# Patient Record
Sex: Female | Born: 1995 | Race: White | Hispanic: No | Marital: Single | Smoking: Former smoker
Health system: Southern US, Community
[De-identification: ages and names within clinical notes are randomized; demographics above are authoritative.]

## PROBLEM LIST (undated history)

## (undated) DIAGNOSIS — F329 Major depressive disorder, single episode, unspecified: Secondary | ICD-10-CM

## (undated) DIAGNOSIS — F4001 Agoraphobia with panic disorder: Secondary | ICD-10-CM

## (undated) DIAGNOSIS — F419 Anxiety disorder, unspecified: Secondary | ICD-10-CM

---
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
## 2018-06-24 ENCOUNTER — Emergency Department
Admission: EM | Admit: 2018-06-24 | Discharge: 2018-06-24 | Disposition: A | Discharge status: Home / Self Care | Payer: BLUE CROSS/BLUE SHIELD | Attending: Emergency Medicine | Admitting: Emergency Medicine

## 2018-06-24 ENCOUNTER — Encounter: Payer: Self-pay | Admitting: Emergency Medicine

## 2018-06-24 ENCOUNTER — Other Ambulatory Visit: Payer: Self-pay

## 2018-06-24 ENCOUNTER — Emergency Department: Payer: BLUE CROSS/BLUE SHIELD

## 2018-06-24 DIAGNOSIS — R109 Unspecified abdominal pain: Secondary | ICD-10-CM | POA: Diagnosis present

## 2018-06-24 DIAGNOSIS — R319 Hematuria, unspecified: Secondary | ICD-10-CM | POA: Insufficient documentation

## 2018-06-24 DIAGNOSIS — Z79899 Other long term (current) drug therapy: Secondary | ICD-10-CM | POA: Insufficient documentation

## 2018-06-24 DIAGNOSIS — N390 Urinary tract infection, site not specified: Secondary | ICD-10-CM | POA: Diagnosis not present

## 2018-06-24 LAB — CBC
HCT: 44.2 % (ref 36.0–46.0)
Hemoglobin: 15.2 g/dL — ABNORMAL HIGH (ref 12.0–15.0)
MCH: 31.6 pg (ref 26.0–34.0)
MCHC: 34.4 g/dL (ref 30.0–36.0)
MCV: 91.9 fL (ref 80.0–100.0)
Platelets: 255 10*3/uL (ref 150–400)
RBC: 4.81 MIL/uL (ref 3.87–5.11)
RDW: 12.7 % (ref 11.5–15.5)
WBC: 12.7 10*3/uL — ABNORMAL HIGH (ref 4.0–10.5)
nRBC: 0 % (ref 0.0–0.2)

## 2018-06-24 LAB — COMPREHENSIVE METABOLIC PANEL
ALT: 21 U/L (ref 0–44)
AST: 35 U/L (ref 15–41)
Albumin: 4.5 g/dL (ref 3.5–5.0)
Alkaline Phosphatase: 63 U/L (ref 38–126)
Anion gap: 10 (ref 5–15)
BUN: 12 mg/dL (ref 6–20)
CO2: 27 mmol/L (ref 22–32)
Calcium: 9.8 mg/dL (ref 8.9–10.3)
Chloride: 102 mmol/L (ref 98–111)
Creatinine, Ser: 0.94 mg/dL (ref 0.44–1.00)
GFR calc Af Amer: >60 mL/min (ref >60)
GFR calc non Af Amer: >60 mL/min (ref >60)
Glucose, Bld: 101 mg/dL — ABNORMAL HIGH (ref 70–99)
Potassium: 3.8 mmol/L (ref 3.5–5.1)
Sodium: 139 mmol/L (ref 135–145)
Total Bilirubin: 0.7 mg/dL (ref 0.3–1.2)
Total Protein: 7.8 g/dL (ref 6.5–8.1)

## 2018-06-24 LAB — URINALYSIS, COMPLETE (UACMP) WITH MICROSCOPIC
Bilirubin Urine: NEGATIVE
Glucose, UA: NEGATIVE mg/dL
Ketones, ur: NEGATIVE mg/dL
Nitrite: POSITIVE — AB
Protein, ur: 100 mg/dL — AB
RBC / HPF: >50 RBC/hpf — ABNORMAL HIGH (ref 0–5)
Specific Gravity, Urine: 1.016 (ref 1.005–1.030)
WBC, UA: >50 WBC/hpf — ABNORMAL HIGH (ref 0–5)
pH: 5 (ref 5.0–8.0)

## 2018-06-24 LAB — POCT PREGNANCY, URINE: Preg Test, Ur: NEGATIVE

## 2018-06-24 LAB — LIPASE, BLOOD: Lipase: 25 U/L (ref 11–51)

## 2018-06-24 MED ORDER — CEFTRIAXONE SODIUM 1 G IJ SOLR
1.0000 g | Freq: Once | INTRAMUSCULAR | Status: AC
  Administered 2018-06-24: 1 g via INTRAMUSCULAR
  Filled 2018-06-24: qty 10

## 2018-06-24 MED ORDER — LIDOCAINE HCL (PF) 1 % IJ SOLN
5.0000 mL | Freq: Once | INTRAMUSCULAR | Status: AC
  Administered 2018-06-24: 5 mL via INTRADERMAL
  Filled 2018-06-24: qty 5

## 2018-06-24 MED ORDER — NITROFURANTOIN MONOHYD MACRO 100 MG PO CAPS: 100.0000 mg | ORAL_CAPSULE | Freq: Two times a day (BID) | ORAL | 0 refills | Status: DC

## 2018-06-24 MED ORDER — SODIUM CHLORIDE 0.9% FLUSH: 3.0000 mL | Freq: Once | INTRAVENOUS | Status: DC

## 2018-06-24 NOTE — ED Notes (Signed)
See triage note  States she developed some discomfort to right flank area and into right abd  States she had some uti sx's about 1 week ago  But pain became worse this am

## 2018-06-24 NOTE — ED Triage Notes (Signed)
Lower R abdominal pain since this am. Denies dysuria.

## 2018-06-24 NOTE — ED Provider Notes (Signed)
[facility] Emergency Department Provider Note  ____________________________________________   First MD Initiated Contact with Patient 06/24/18 1507     (approximate)  I have reviewed the triage vital signs and the nursing notes.   HISTORY  Chief Complaint Abdominal Pain    HPI Dana Mcmillan is a 23 y.o. female presents emergency department complaint of abdominal pain.  She states that discomfort is in the right flank area and then goes into the lower abdomen.  [place] took some Azo and felt better.  History of UTIs.  She states the pain became worse this morning.  She denies fever, chills, vomiting or diarrhea.    Past Medical History:  Diagnosis Date  . Anxiety   . MDD (major depressive disorder)   . Panic disorder with agoraphobia and moderate panic attacks     There are no active problems to display for this patient.   History reviewed. No pertinent surgical history.  Prior to Admission medications   Medication Sig Start Date End Date Taking? Authorizing Provider  nitrofurantoin, macrocrystal-monohydrate, (MACROBID) 100 MG capsule Take 1 capsule (100 mg total) by mouth 2 (two) times daily. 06/24/18   Sherrie MustacheFisher, Roselyn BeringSusan W, PA-C  NUVARING 0.12-0.015 MG/24HR vaginal ring Place 1 each vaginally every 28 (twenty-eight) days.  10/19/15   [provider]  ondansetron (ZOFRAN ODT) 4 MG disintegrating tablet Take 1 tablet (4 mg total) by mouth every 8 (eight) hours as needed for nausea or vomiting. 11/03/15   Derwood KaplanNanavati, Ankit, MD  ondansetron (ZOFRAN) 4 MG tablet Take 1 tablet (4 mg total) by mouth every 8 (eight) hours as needed for nausea or vomiting. 04/18/16   Tilden Fossaees, Elizabeth, MD    Allergies Patient has no known allergies.  No family history on file.  Social History Social History   Tobacco Use  . Smoking status: Never Smoker  Substance Use Topics  . Alcohol use: No  . Drug use: No    Review of Systems   Constitutional: No fever/chills Eyes: No visual changes. ENT: No sore throat. Respiratory: Denies cough Gastrointestinal: Positive for right-sided abdominal pain Genitourinary: Negative for dysuria. Musculoskeletal: Negative for back pain. Skin: Negative for rash.    ____________________________________________   PHYSICAL EXAM:  VITAL SIGNS: ED Triage Vitals  Enc Vitals Group     BP 06/24/18 1402 138/83     Pulse Rate 06/24/18 1402 89     Resp 06/24/18 1402 18     Temp 06/24/18 1402 98.4 F (36.9 C)     Temp Source 06/24/18 1402 Oral     SpO2 06/24/18 1402 99 %     Weight 06/24/18 1402 125 lb (56.7 kg)     Height 06/24/18 1402 5\' 6"  (1.676 m)     Head Circumference --      Peak Flow --      Pain Score 06/24/18 1404 8     Pain Loc --      Pain Edu? --      Excl. in GC? --     Constitutional: Alert and oriented. Well appearing and in no acute distress. Eyes: Conjunctivae are normal.  Head: Atraumatic. Nose: No congestion/rhinnorhea. Mouth/Throat: Mucous membranes are moist.   Neck:  supple no lymphadenopathy noted Cardiovascular: Normal rate, regular rhythm. Heart sounds are normal Respiratory: Normal respiratory effort.  No retractions, lungs c t a  Abd: soft nontender bs normal all 4 quad, McBurney's point is not tender, no CVA tenderness  GU: deferred Musculoskeletal: FROM all extremities, warm and well perfused Neurologic:  Normal speech and language.  Skin:  Skin is warm, dry and intact. No rash noted. Psychiatric: Mood and affect are normal. Speech and behavior are normal.  ____________________________________________   LABS (all labs ordered are listed, but only abnormal results are displayed)  Labs Reviewed  COMPREHENSIVE METABOLIC PANEL - Abnormal; Notable for the following components:      Result Value   Glucose, Bld 101 (*)    All other components within normal limits  CBC - Abnormal; Notable for the following components:   WBC 12.7 (*)     Hemoglobin 15.2 (*)    All other components within normal limits  URINALYSIS, COMPLETE (UACMP) WITH MICROSCOPIC - Abnormal; Notable for the following components:   Color, Urine YELLOW (*)    APPearance CLOUDY (*)    Hgb urine dipstick MODERATE (*)    Protein, ur 100 (*)    Nitrite POSITIVE (*)    Leukocytes,Ua LARGE (*)    RBC / HPF >50 (*)    WBC, UA >50 (*)    Bacteria, UA MANY (*)    All other components within normal limits  URINE CULTURE  LIPASE, BLOOD  POC URINE PREG, ED  POCT PREGNANCY, URINE   ____________________________________________   ____________________________________________  RADIOLOGY  Ultrasound pelvis is negative for ovarian cyst or torsion  ____________________________________________   PROCEDURES  Procedure(s) performed: No  Procedures    ____________________________________________   INITIAL IMPRESSION / ASSESSMENT AND PLAN / ED COURSE  Pertinent labs & imaging results that were available during my care of the patient were reviewed by me and considered in my medical decision making (see chart for details).   Patient is 23 year old female presents emergency department with complaints of right flank pain.  Physical exam patient appears well.  Abdomen is basically not tender.  UA shows moderate hemoglobin, nitrite positive, large leuks, gram 50 RBCs, greater than 50 WBCs, many bacteria. CBC has elevated WBC at 12.7, urine pregnant is negative, lipase is normal, comprehensive metabolic panel is normal  Explained findings to the patient.  She states her mother was concerned about her ovary rupturing.  And is requesting a ultrasound.  Ultrasound of the pelvis is normal  Explained all the findings to the patient.  She was given an injection of Rocephin 1 g IM here in the ED.  A prescription for Macrobid.  Urine culture was added to the urine to ensure proper antibiotic treatment.  She is to follow-up with her regular doctor if not better in 3  days.  Return emergency department worsening.  Patient states she understands will comply.  She was discharged in stable condition.     As part of my medical decision making, I reviewed the following data within the electronic MEDICAL RECORD NUMBER Nursing notes reviewed and incorporated, Labs reviewed see above, Old chart reviewed, Radiograph reviewed pelvic ultrasound normal, Notes from prior ED visits and [place] Controlled Substance Database  ____________________________________________   FINAL CLINICAL IMPRESSION(S) / ED DIAGNOSES  Final diagnoses:  Abdominal pain  Urinary tract infection with hematuria, site unspecified      NEW MEDICATIONS STARTED DURING THIS VISIT:  New Prescriptions   NITROFURANTOIN, MACROCRYSTAL-MONOHYDRATE, (MACROBID) 100 MG CAPSULE    Take 1 capsule (100 mg total) by mouth 2 (two) times daily.     Note:  This document was prepared using Dragon voice recognition software and may include unintentional dictation errors.    Faythe Ghee, PA-C  06/24/18 1734    Emily Filbert, MD 06/24/18 1945

## 2018-06-24 NOTE — Discharge Instructions (Signed)
Follow-up with your regular doctor or in acute care if not better in 3 to 5 days.  Return emergency department worsening.  Take medication as prescribed.  Urine culture is been added to your urine to ensure we have you on the correct antibiotic.

## 2018-06-26 LAB — URINE CULTURE: Culture: 90000 — AB

## 2019-03-17 ENCOUNTER — Other Ambulatory Visit: Payer: Self-pay

## 2019-03-17 ENCOUNTER — Encounter: Payer: Self-pay | Admitting: Emergency Medicine

## 2019-03-17 ENCOUNTER — Emergency Department
Admission: EM | Admit: 2019-03-17 | Discharge: 2019-03-17 | Disposition: A | Discharge status: Home / Self Care | Payer: BLUE CROSS/BLUE SHIELD | Attending: Emergency Medicine | Admitting: Emergency Medicine

## 2019-03-17 DIAGNOSIS — Z87891 Personal history of nicotine dependence: Secondary | ICD-10-CM | POA: Insufficient documentation

## 2019-03-17 DIAGNOSIS — N390 Urinary tract infection, site not specified: Secondary | ICD-10-CM

## 2019-03-17 DIAGNOSIS — R1110 Vomiting, unspecified: Secondary | ICD-10-CM | POA: Insufficient documentation

## 2019-03-17 DIAGNOSIS — Z79899 Other long term (current) drug therapy: Secondary | ICD-10-CM | POA: Insufficient documentation

## 2019-03-17 LAB — URINALYSIS, COMPLETE (UACMP) WITH MICROSCOPIC
Bilirubin Urine: NEGATIVE
Glucose, UA: NEGATIVE mg/dL
Ketones, ur: NEGATIVE mg/dL
Nitrite: POSITIVE — AB
Protein, ur: 100 mg/dL — AB
RBC / HPF: >50 RBC/hpf — ABNORMAL HIGH (ref 0–5)
Specific Gravity, Urine: 1.014 (ref 1.005–1.030)
Squamous Epithelial / LPF: NONE SEEN (ref 0–5)
WBC, UA: >50 WBC/hpf — ABNORMAL HIGH (ref 0–5)
pH: 6 (ref 5.0–8.0)

## 2019-03-17 LAB — CBC
HCT: 42.6 % (ref 36.0–46.0)
Hemoglobin: 14.8 g/dL (ref 12.0–15.0)
MCH: 32.8 pg (ref 26.0–34.0)
MCHC: 34.7 g/dL (ref 30.0–36.0)
MCV: 94.5 fL (ref 80.0–100.0)
Platelets: 265 10*3/uL (ref 150–400)
RBC: 4.51 MIL/uL (ref 3.87–5.11)
RDW: 11.9 % (ref 11.5–15.5)
WBC: 15.5 10*3/uL — ABNORMAL HIGH (ref 4.0–10.5)
nRBC: 0 % (ref 0.0–0.2)

## 2019-03-17 LAB — COMPREHENSIVE METABOLIC PANEL
ALT: 16 U/L (ref 0–44)
AST: 22 U/L (ref 15–41)
Albumin: 4.2 g/dL (ref 3.5–5.0)
Alkaline Phosphatase: 65 U/L (ref 38–126)
Anion gap: 9 (ref 5–15)
BUN: 13 mg/dL (ref 6–20)
CO2: 27 mmol/L (ref 22–32)
Calcium: 9.7 mg/dL (ref 8.9–10.3)
Chloride: 102 mmol/L (ref 98–111)
Creatinine, Ser: 0.93 mg/dL (ref 0.44–1.00)
GFR calc Af Amer: >60 mL/min (ref >60)
GFR calc non Af Amer: >60 mL/min (ref >60)
Glucose, Bld: 94 mg/dL (ref 70–99)
Potassium: 4.4 mmol/L (ref 3.5–5.1)
Sodium: 138 mmol/L (ref 135–145)
Total Bilirubin: 0.8 mg/dL (ref 0.3–1.2)
Total Protein: 7.7 g/dL (ref 6.5–8.1)

## 2019-03-17 LAB — POCT PREGNANCY, URINE: Preg Test, Ur: NEGATIVE

## 2019-03-17 LAB — LIPASE, BLOOD: Lipase: 22 U/L (ref 11–51)

## 2019-03-17 MED ORDER — CEPHALEXIN 500 MG PO CAPS: 500.0000 mg | ORAL_CAPSULE | Freq: Four times a day (QID) | ORAL | 0 refills | Status: DC

## 2019-03-17 MED ORDER — SODIUM CHLORIDE 0.9 % IV BOLUS
1000.0000 mL | Freq: Once | INTRAVENOUS | Status: AC
  Administered 2019-03-17: 1000 mL via INTRAVENOUS

## 2019-03-17 MED ORDER — PHENAZOPYRIDINE HCL 200 MG PO TABS: 200.0000 mg | ORAL_TABLET | Freq: Three times a day (TID) | ORAL | 0 refills | Status: DC | PRN

## 2019-03-17 MED ORDER — CEPHALEXIN 500 MG PO CAPS: 500.0000 mg | ORAL_CAPSULE | Freq: Four times a day (QID) | ORAL | 0 refills | Status: AC

## 2019-03-17 MED ORDER — PHENAZOPYRIDINE HCL 200 MG PO TABS: 200.0000 mg | ORAL_TABLET | Freq: Three times a day (TID) | ORAL | 0 refills | Status: AC | PRN

## 2019-03-17 MED ORDER — SODIUM CHLORIDE 0.9 % IV SOLN
1.0000 g | Freq: Once | INTRAVENOUS | Status: AC
  Administered 2019-03-17: 1 g via INTRAVENOUS
  Filled 2019-03-17: qty 10

## 2019-03-17 NOTE — ED Triage Notes (Signed)
Patient presents to the ED with lower abdominal pain x 2 days and vomiting that began today.  Patient reports vomiting x 3.  Patient states prior to abdominal pain, she was having halting urination with slight burning.  Patient states she began taking azo and urinary symptoms resolved.  Patient reports history of UTIs and pyelonephritis.

## 2019-03-17 NOTE — ED Provider Notes (Signed)
[facility] Emergency Department Provider Note  ____________________________________________   I have reviewed the triage vital signs and the nursing notes.   HISTORY  Chief Complaint Abdominal Pain and Emesis   History limited by: Not Limited   HPI Dana Mcmillan is a 24 y.o. female who presents to the emergency department today because of concern for abdominal pain, nausea, vomiting and possible UTI. The patient states that the abdominal pain is located on the left side of her abdomen. Has been present for the past couple of days. The patient has also noticed some change in her urination including some burning. Because of this she took a dose of azo. The pain does remind her of when she has had UTIs and kidney infections in the past.   Records reviewed. Per medical record review patient has a history of pyelonephritis. Has had abdominal imaging in the past without kidney stones.   Past Medical History:  Diagnosis Date  . Anxiety   . MDD (major depressive disorder)   . Panic disorder with agoraphobia and moderate panic attacks     There are no problems to display for this patient.   History reviewed. No pertinent surgical history.  Prior to Admission medications   Medication Sig Start Date End Date Taking? Authorizing Provider  nitrofurantoin, macrocrystal-monohydrate, (MACROBID) 100 MG capsule Take 1 capsule (100 mg total) by mouth 2 (two) times daily. 06/24/18   Sherrie Mustache Roselyn Bering, PA-C  NUVARING 0.12-0.015 MG/24HR vaginal ring Place 1 each vaginally every 28 (twenty-eight) days.  10/19/15   [provider]  ondansetron (ZOFRAN ODT) 4 MG disintegrating tablet Take 1 tablet (4 mg total) by mouth every 8 (eight) hours as needed for nausea or vomiting. 11/03/15   Derwood Kaplan, MD  ondansetron (ZOFRAN) 4 MG tablet Take 1 tablet (4 mg total) by mouth every 8 (eight) hours as needed for nausea or vomiting. 04/18/16   Tilden Fossa, MD     Allergies Patient has no known allergies.  No family history on file.  Social History Social History   Tobacco Use  . Smoking status: Former Games developer  . Smokeless tobacco: Never Used  Substance Use Topics  . Alcohol use: No  . Drug use: No    Review of Systems Constitutional: No fever/chills Eyes: No visual changes. ENT: No sore throat. Cardiovascular: Denies chest pain. Respiratory: Denies shortness of breath. Gastrointestinal: Positive for abdominal pain, nausea and vomiting. Genitourinary: Positive for dysuria.  Musculoskeletal: Negative for back pain. Skin: Negative for rash. Neurological: Negative for headaches, focal weakness or numbness.  ____________________________________________   PHYSICAL EXAM:  VITAL SIGNS: ED Triage Vitals  Enc Vitals Group     BP 03/17/19 1717 129/71     Pulse Rate 03/17/19 1717 77     Resp 03/17/19 1717 18     Temp 03/17/19 1717 98.7 F (37.1 C)     Temp Source 03/17/19 1717 Oral     SpO2 03/17/19 1717 98 %     Weight 03/17/19 1719 120 lb (54.4 kg)     Height 03/17/19 1719 5\' 7"  (1.702 m)     Head Circumference --      Peak Flow --      Pain Score 03/17/19 1718 9   Constitutional: Alert and oriented.  Eyes: Conjunctivae are normal.  ENT      Head: Normocephalic and atraumatic.      Nose: No congestion/rhinnorhea.      Mouth/Throat: Mucous membranes are moist.  Neck: No stridor. Hematological/Lymphatic/Immunilogical: No cervical lymphadenopathy. Cardiovascular: Normal rate, regular rhythm.  No murmurs, rubs, or gallops.  Respiratory: Normal respiratory effort without tachypnea nor retractions. Breath sounds are clear and equal bilaterally. No wheezes/rales/rhonchi. Gastrointestinal: Soft and minimally tender to palpation in the left lower abdomen.  Genitourinary: Deferred Musculoskeletal: Normal range of motion in all extremities. No lower extremity edema. Neurologic:  Normal speech and language. No gross focal  neurologic deficits are appreciated.  Skin:  Skin is warm, dry and intact. No rash noted. Psychiatric: Mood and affect are normal. Speech and behavior are normal. Patient exhibits appropriate insight and judgment.  ____________________________________________    LABS (pertinent positives/negatives)  Upreg negative Lipase 22 CBC wbc 15.5, hgb 14.8, plt 265 CMP wnl UA turbid, moderate hgb dipstick, positive nitrite, large leukocytes, >50 rbc and wbc  ____________________________________________   EKG  None  ____________________________________________    RADIOLOGY  None  ____________________________________________   PROCEDURES  Procedures  ____________________________________________   INITIAL IMPRESSION / ASSESSMENT AND PLAN / ED COURSE  Pertinent labs & imaging results that were available during my care of the patient were reviewed by me and considered in my medical decision making (see chart for details).   Patient presented to the emergency department today because of concern for possible UTI. UA is consistent with UTI. Patient was given IVFs and antibiotics. I discussed the findings with the patient. There was blood in the urine as well, however previous imaging without evidence of kidney stone and I do think this could be explained by infection. Will discharge with further antibiotics.    ____________________________________________   FINAL CLINICAL IMPRESSION(S) / ED DIAGNOSES  Final diagnoses:  Lower urinary tract infectious disease     Note: This dictation was prepared with Dragon dictation. Any transcriptional errors that result from this process are unintentional     Nance Pear, MD 03/17/19 2013

## 2019-03-17 NOTE — Discharge Instructions (Addendum)
Please seek medical attention for any high fevers, chest pain, shortness of breath, change in behavior, persistent vomiting, bloody stool or any other new or concerning symptoms.  

## 2019-09-24 HISTORY — PX: ANKLE SURGERY: SHX546

## 2019-10-14 DIAGNOSIS — S82841A Displaced bimalleolar fracture of right lower leg, initial encounter for closed fracture: Secondary | ICD-10-CM | POA: Insufficient documentation

## 2019-11-14 ENCOUNTER — Emergency Department
Admission: EM | Admit: 2019-11-14 | Discharge: 2019-11-14 | Disposition: A | Discharge status: Home / Self Care | Payer: Self-pay | Attending: Emergency Medicine | Admitting: Emergency Medicine

## 2019-11-14 ENCOUNTER — Other Ambulatory Visit: Payer: Self-pay

## 2019-11-14 ENCOUNTER — Emergency Department: Payer: Self-pay

## 2019-11-14 DIAGNOSIS — Z5321 Procedure and treatment not carried out due to patient leaving prior to being seen by health care provider: Secondary | ICD-10-CM | POA: Insufficient documentation

## 2019-11-14 DIAGNOSIS — R0781 Pleurodynia: Secondary | ICD-10-CM | POA: Insufficient documentation

## 2019-11-14 LAB — CBC
HCT: 37.6 % (ref 36.0–46.0)
Hemoglobin: 12.8 g/dL (ref 12.0–15.0)
MCH: 31.8 pg (ref 26.0–34.0)
MCHC: 34 g/dL (ref 30.0–36.0)
MCV: 93.3 fL (ref 80.0–100.0)
Platelets: 229 10*3/uL (ref 150–400)
RBC: 4.03 MIL/uL (ref 3.87–5.11)
RDW: 11.9 % (ref 11.5–15.5)
WBC: 11.8 10*3/uL — ABNORMAL HIGH (ref 4.0–10.5)
nRBC: 0 % (ref 0.0–0.2)

## 2019-11-14 LAB — BASIC METABOLIC PANEL
Anion gap: 11 (ref 5–15)
BUN: 7 mg/dL (ref 6–20)
CO2: 25 mmol/L (ref 22–32)
Calcium: 9.5 mg/dL (ref 8.9–10.3)
Chloride: 101 mmol/L (ref 98–111)
Creatinine, Ser: 0.69 mg/dL (ref 0.44–1.00)
GFR, Estimated: >60 mL/min (ref >60)
Glucose, Bld: 105 mg/dL — ABNORMAL HIGH (ref 70–99)
Potassium: 3.8 mmol/L (ref 3.5–5.1)
Sodium: 137 mmol/L (ref 135–145)

## 2019-11-14 LAB — TROPONIN I (HIGH SENSITIVITY): Troponin I (High Sensitivity): 4 ng/L (ref <18)
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
## 2019-11-14 NOTE — ED Notes (Signed)
Pt told xray she was leaving and left due to wait time.

## 2019-12-29 ENCOUNTER — Ambulatory Visit: Payer: Medicaid Other

## 2020-07-01 ENCOUNTER — Ambulatory Visit: Payer: Self-pay

## 2020-07-07 ENCOUNTER — Encounter (HOSPITAL_BASED_OUTPATIENT_CLINIC_OR_DEPARTMENT_OTHER): Payer: Self-pay

## 2020-07-07 ENCOUNTER — Emergency Department (HOSPITAL_BASED_OUTPATIENT_CLINIC_OR_DEPARTMENT_OTHER): Payer: BLUE CROSS/BLUE SHIELD

## 2020-07-07 ENCOUNTER — Emergency Department (HOSPITAL_BASED_OUTPATIENT_CLINIC_OR_DEPARTMENT_OTHER)
Admission: EM | Admit: 2020-07-07 | Discharge: 2020-07-07 | Discharge status: Against Medical Advice | Payer: BLUE CROSS/BLUE SHIELD | Attending: Emergency Medicine | Admitting: Emergency Medicine

## 2020-07-07 ENCOUNTER — Other Ambulatory Visit: Payer: Self-pay

## 2020-07-07 DIAGNOSIS — Z91040 Latex allergy status: Secondary | ICD-10-CM | POA: Insufficient documentation

## 2020-07-07 DIAGNOSIS — W228XXA Striking against or struck by other objects, initial encounter: Secondary | ICD-10-CM | POA: Diagnosis not present

## 2020-07-07 DIAGNOSIS — S59911A Unspecified injury of right forearm, initial encounter: Secondary | ICD-10-CM | POA: Diagnosis present

## 2020-07-07 DIAGNOSIS — S51811A Laceration without foreign body of right forearm, initial encounter: Secondary | ICD-10-CM | POA: Insufficient documentation

## 2020-07-07 DIAGNOSIS — Z87891 Personal history of nicotine dependence: Secondary | ICD-10-CM | POA: Insufficient documentation

## 2020-07-07 DIAGNOSIS — S61411A Laceration without foreign body of right hand, initial encounter: Secondary | ICD-10-CM | POA: Insufficient documentation

## 2020-07-07 DIAGNOSIS — S41111A Laceration without foreign body of right upper arm, initial encounter: Secondary | ICD-10-CM

## 2020-07-07 MED ORDER — LIDOCAINE HCL (PF) 1 % IJ SOLN
10.0000 mL | Freq: Once | INTRAMUSCULAR | Status: DC
  Filled 2020-07-07: qty 10
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
## 2020-07-07 NOTE — ED Provider Notes (Addendum)
[facility] EMERGENCY DEPT Provider Note   CSN: [id number] Arrival date & time: 07/07/20  0155     History Chief Complaint  Patient presents with   Extremity Laceration    Dana Mcmillan is a 25 y.o. female.  Patient is a 25 year old female who presents with an injury to her right hand and forearm.  She states that she punched a glass window about an hour prior to arrival.  She states that she was upset because she is losing her dad.  She did not elaborate on this.  She denies any suicidal ideations.  She has no thoughts of wanting to hurt herself.  She denies any other injuries.      Past Medical History:  Diagnosis Date   Anxiety    MDD (major depressive disorder)    Panic disorder with agoraphobia and moderate panic attacks     There are no problems to display for this patient.   History reviewed. No pertinent surgical history.   OB History   No obstetric history on file.     History reviewed. No pertinent family history.  Social History   Tobacco Use   Smoking status: Former    Pack years: 0.00   Smokeless tobacco: Never  Vaping Use   Vaping Use: Every day  Substance Use Topics   Alcohol use: No   Drug use: No    Home Medications Prior to Admission medications   Medication Sig Start Date End Date Taking? Authorizing Provider  nitrofurantoin, macrocrystal-monohydrate, (MACROBID) 100 MG capsule Take 1 capsule (100 mg total) by mouth 2 (two) times daily. 06/24/18   Sherrie Mustache Roselyn Bering, PA-C  NUVARING 0.12-0.015 MG/24HR vaginal ring Place 1 each vaginally every 28 (twenty-eight) days.  10/19/15   [provider]  ondansetron (ZOFRAN ODT) 4 MG disintegrating tablet Take 1 tablet (4 mg total) by mouth every 8 (eight) hours as needed for nausea or vomiting. 11/03/15   Derwood Kaplan, MD  ondansetron (ZOFRAN) 4 MG tablet Take 1 tablet (4 mg total) by mouth every 8 (eight) hours as needed for nausea or vomiting. 04/18/16   Tilden Fossa, MD     Allergies    Latex  Review of Systems   Review of Systems  Constitutional:  Negative for fever.  Gastrointestinal:  Negative for nausea and vomiting.  Musculoskeletal:  Negative for arthralgias and joint swelling.  Skin:  Positive for wound.  Neurological:  Negative for weakness, numbness and headaches.  Psychiatric/Behavioral:  Negative for suicidal ideas.    Physical Exam Updated Vital Signs BP 124/79 (BP Location: Right Arm)   Pulse 80   Temp 98.3 F (36.8 C) (Oral)   Resp 18   Ht 5\' 7"  (1.702 m)   Wt 54.4 kg   SpO2 100%   BMI 18.79 kg/m   Physical Exam Constitutional:      Appearance: She is well-developed.  HENT:     Head: Normocephalic and atraumatic.  Cardiovascular:     Rate and Rhythm: Normal rate.  Pulmonary:     Effort: Pulmonary effort is normal.  Musculoskeletal:        General: Tenderness present.     Cervical back: Normal range of motion and neck supple.     Comments: +2 cm laceration to her right forearm.  1 cm laceration to the ulnar side of her right hand.  There are some smaller superficial appearing lacerations on the fingers.  She has full range of motion of the fingers.  Normal sensation distally.  No active bleeding.  No bony tenderness.  Skin:    General: Skin is warm and dry.  Neurological:     Mental Status: She is alert and oriented to person, place, and time.    ED Results / Procedures / Treatments   Labs (all labs ordered are listed, but only abnormal results are displayed) Labs Reviewed - No data to display  EKG None  Radiology No results found.  Procedures Procedures   Medications Ordered in ED Medications  lidocaine (PF) (XYLOCAINE) 1 % injection 10 mL (has no administration in time range)    ED Course  I have reviewed the triage vital signs and the nursing notes.  Pertinent labs & imaging results that were available during my care of the patient were reviewed by me and considered in my medical decision making  (see chart for details).    MDM Rules/Calculators/A&P                          Patient presents with lacerations to her right arm/hand.  X-rays were ordered to rule out foreign bodies.  After a short period of waiting, patient said she does not have time for this and left without further treatment.  She seemed to have good movement and range of motion of her hand on initial exam.  However I did not have a chance to explore the wounds.  She was advised that they did need suturing.  She does not express any thoughts of self-harm.  She left AMA. Final Clinical Impression(s) / ED Diagnoses Final diagnoses:  Laceration of multiple sites of right upper extremity, initial encounter    Rx / DC Orders ED Discharge Orders     None        Rolan Bucco, MD 07/07/20 4709    Rolan Bucco, MD 07/07/20 0505

## 2020-09-14 ENCOUNTER — Ambulatory Visit (INDEPENDENT_AMBULATORY_CARE_PROVIDER_SITE_OTHER): Payer: BLUE CROSS/BLUE SHIELD | Admitting: *Deleted

## 2020-09-14 ENCOUNTER — Other Ambulatory Visit: Payer: Self-pay

## 2020-09-14 DIAGNOSIS — N390 Urinary tract infection, site not specified: Secondary | ICD-10-CM

## 2020-09-14 DIAGNOSIS — Z3201 Encounter for pregnancy test, result positive: Secondary | ICD-10-CM | POA: Diagnosis not present

## 2020-09-14 DIAGNOSIS — N926 Irregular menstruation, unspecified: Secondary | ICD-10-CM

## 2020-09-14 LAB — POCT URINALYSIS DIPSTICK
Bilirubin, UA: NEGATIVE
Glucose, UA: NEGATIVE
Ketones, UA: NEGATIVE
Leukocytes, UA: NEGATIVE
Nitrite, UA: NEGATIVE
Protein, UA: POSITIVE — AB
Spec Grav, UA: 1.02 (ref 1.010–1.025)
Urobilinogen, UA: 0.2 E.U./dL
pH, UA: 6 (ref 5.0–8.0)

## 2020-09-14 LAB — POCT URINE PREGNANCY: Preg Test, Ur: POSITIVE — AB

## 2020-09-14 NOTE — Progress Notes (Signed)
Patient was assessed and managed by nursing staff during this encounter. I have reviewed the chart and agree with the documentation and plan. I have also made any necessary editorial changes.  Catalina Antigua, MD 09/14/2020 9:43 AM

## 2020-09-14 NOTE — Progress Notes (Signed)
Ms. Armwood presents today for UPT. She states she gets frequent urinary/kidney infections  LMP:08/07/20    OBJECTIVE: Appears well, in no apparent distress.  OB History   No obstetric history on file.    Home UPT Result:positive In-Office UPT result:positive  I have reviewed the patient's allergies and medications.   ASSESSMENT: Positive pregnancy test Udip positive for RBC and protein.   PLAN Prenatal care to be completed at: [facility] Verification letter given. Pt to make intake and NOB appt. Udip and Urine culture ordered.

## 2020-09-17 LAB — URINE CULTURE, OB REFLEX

## 2020-09-17 LAB — CULTURE, OB URINE

## 2020-09-20 MED ORDER — CEPHALEXIN 500 MG PO CAPS: 500.0000 mg | ORAL_CAPSULE | Freq: Four times a day (QID) | ORAL | 2 refills | Status: DC

## 2020-09-20 NOTE — Addendum Note (Signed)
Addended by: Catalina Antigua on: 09/20/2020 09:29 AM   Modules accepted: Orders

## 2020-10-05 ENCOUNTER — Ambulatory Visit (INDEPENDENT_AMBULATORY_CARE_PROVIDER_SITE_OTHER): Payer: BLUE CROSS/BLUE SHIELD

## 2020-10-05 ENCOUNTER — Other Ambulatory Visit: Payer: Self-pay

## 2020-10-05 VITALS — BP 110/67 | HR 80 | Ht 66.0 in | Wt 119.6 lb

## 2020-10-05 DIAGNOSIS — Z3401 Encounter for supervision of normal first pregnancy, first trimester: Secondary | ICD-10-CM

## 2020-10-05 DIAGNOSIS — Z3A01 Less than 8 weeks gestation of pregnancy: Secondary | ICD-10-CM

## 2020-10-05 DIAGNOSIS — Z3402 Encounter for supervision of normal first pregnancy, second trimester: Secondary | ICD-10-CM | POA: Insufficient documentation

## 2020-10-05 DIAGNOSIS — O219 Vomiting of pregnancy, unspecified: Secondary | ICD-10-CM

## 2020-10-05 DIAGNOSIS — O0990 Supervision of high risk pregnancy, unspecified, unspecified trimester: Secondary | ICD-10-CM | POA: Insufficient documentation

## 2020-10-05 IMAGING — US US OB LIMITED
1 series · 8 of 8 positions shown · non-contrast
Comparison: none

[Series 1: us ob limited · 8 of 8 slices shown]
[im 1/8]
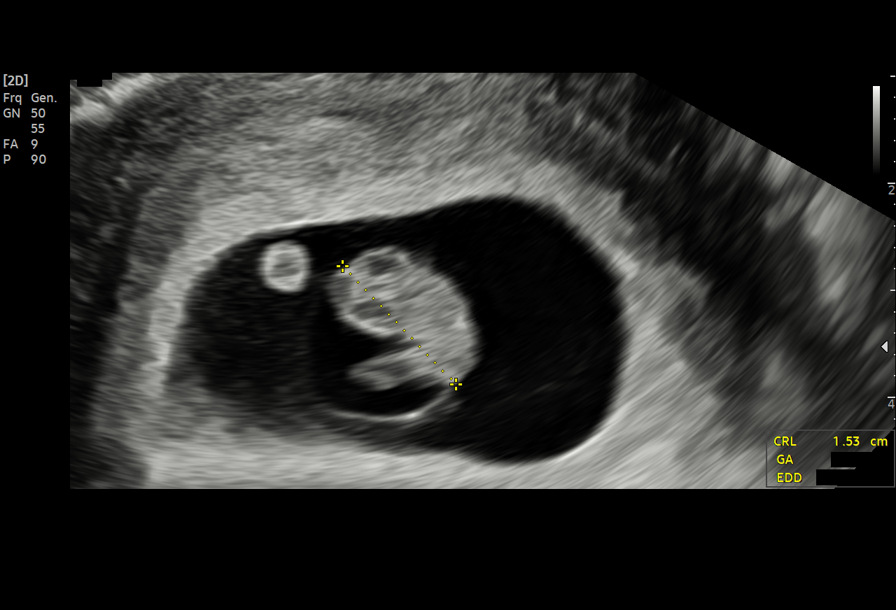
[im 2/8]
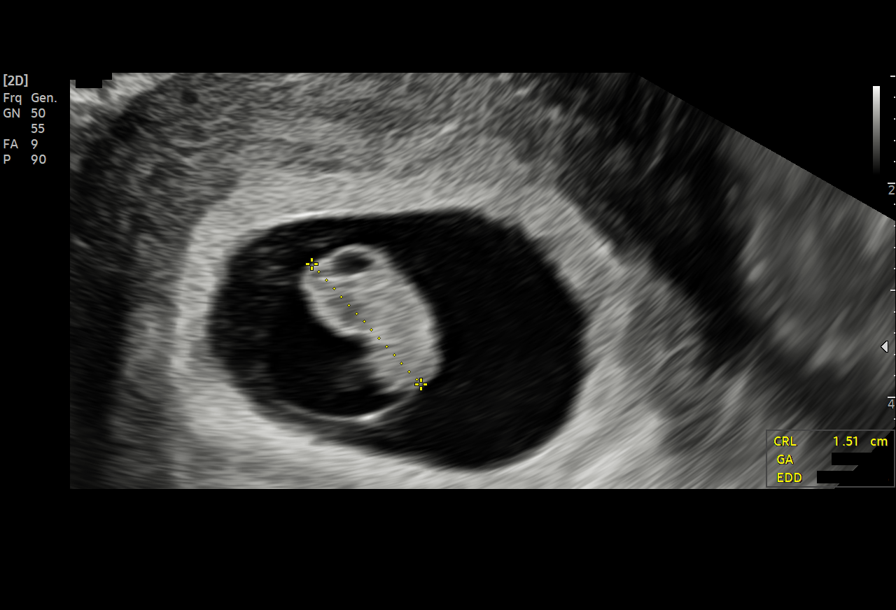
[im 3/8]
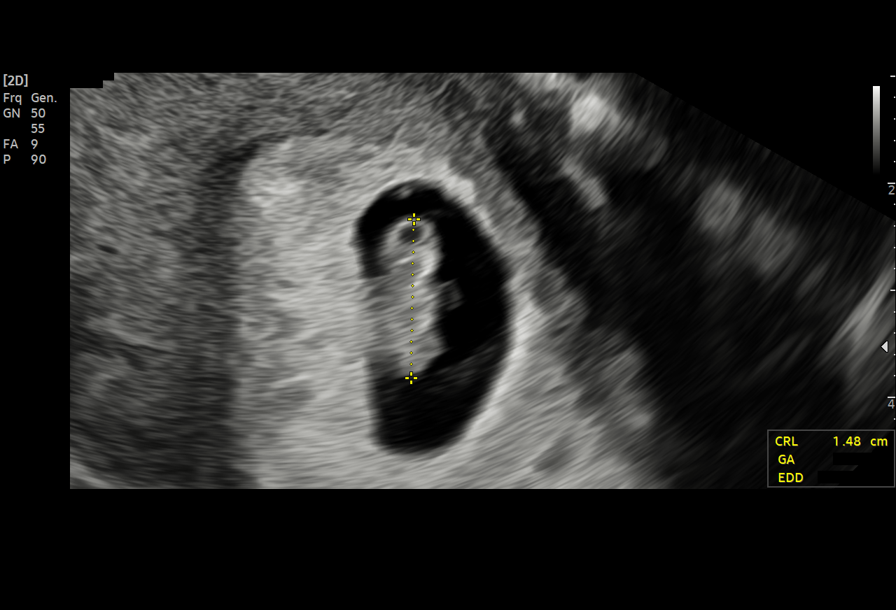
[im 4/8]
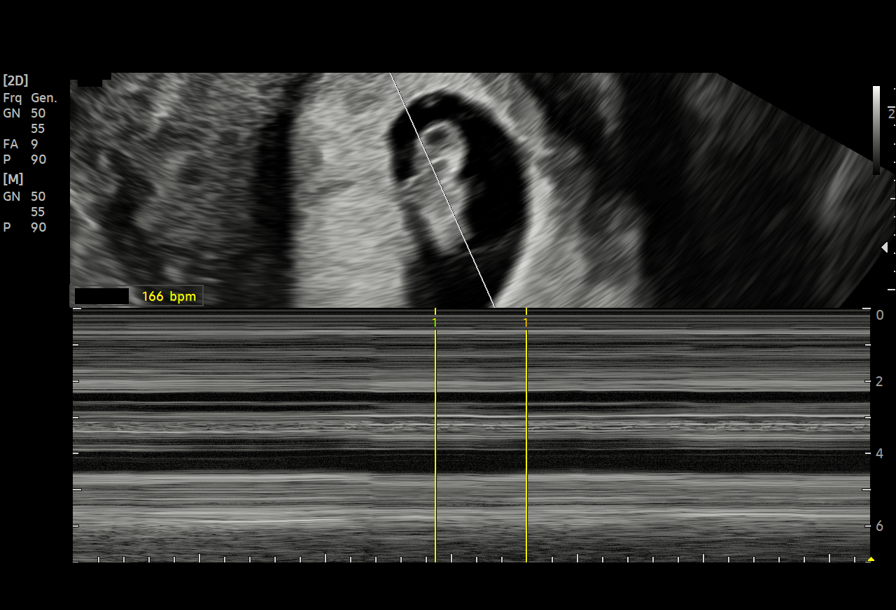
[im 5/8]
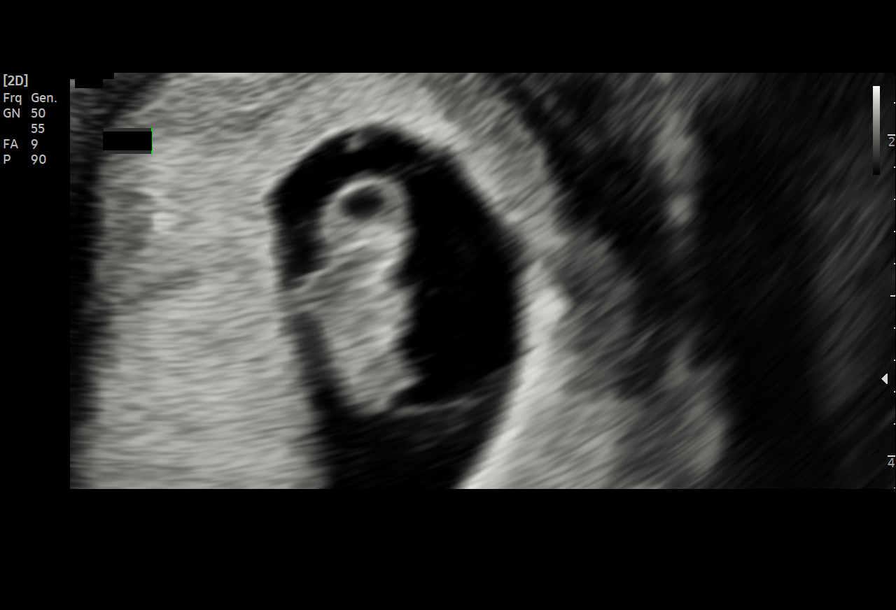
[im 6/8]
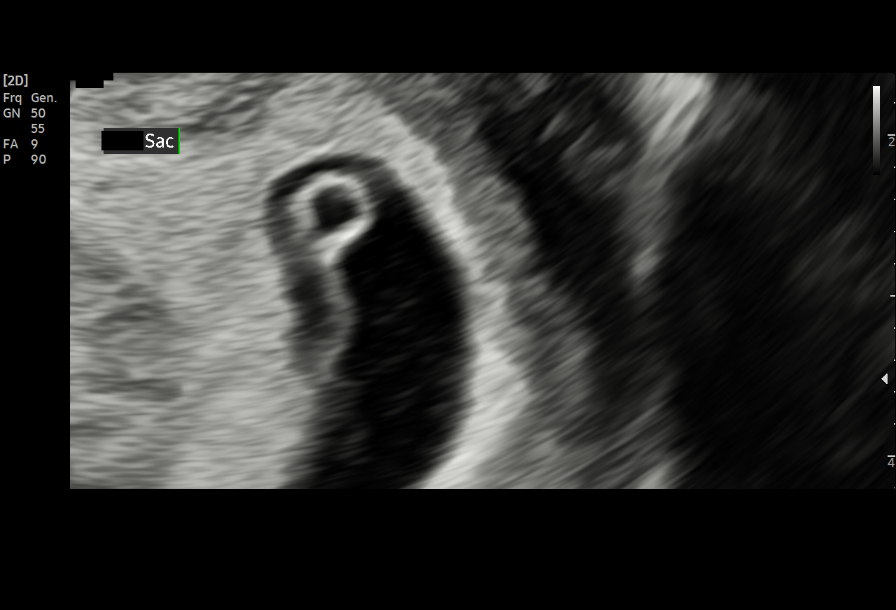
[im 7/8]
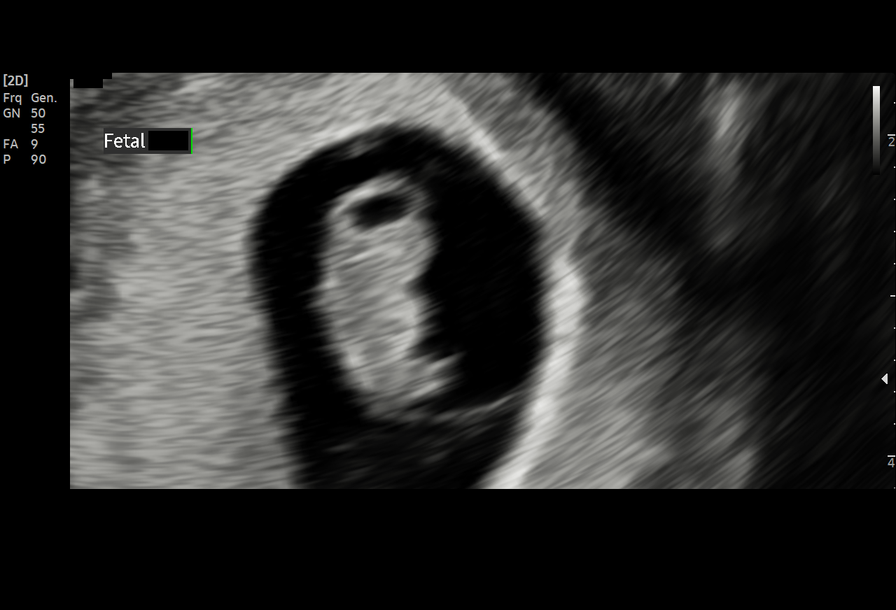
[im 8/8]
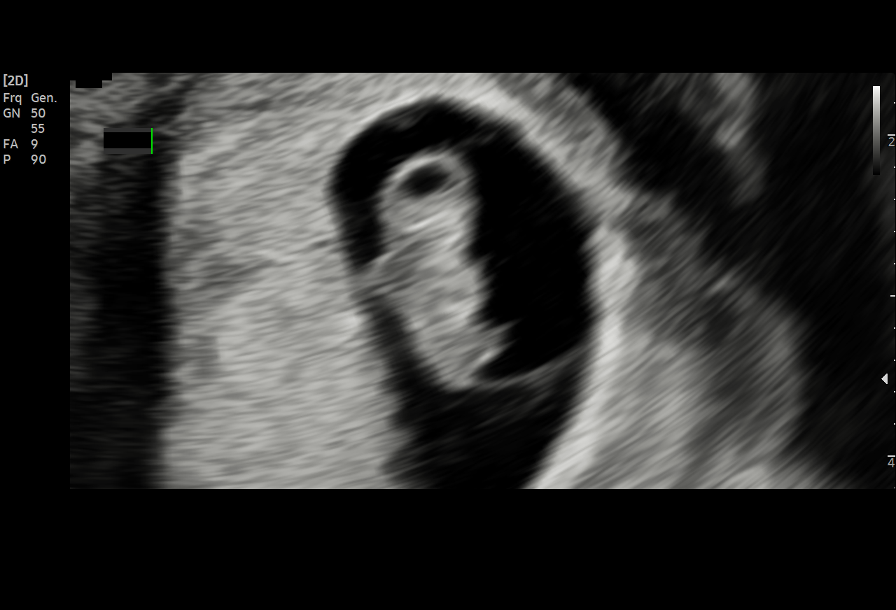

[8 of 8 positions shown; findings below may reference images not displayed]

Healthcare

 1   [HOSPITAL]                        [address].0      [place]

Indications

 Less than 8 weeks gestation of pregnancy
Fetal Evaluation

 Num Of Fetuses:          1
 Fetal Heart Rate(bpm):   166
 Cardiac Activity:        Observed
Biometry

 CRL:      15.1   mm     G. Age:  7w 6d                    EDD:   [DATE]
Gestational Age

 Best:           7w 6d     Det. By:  U/S C R L ([DATE])       EDD:  [DATE]
Comments

 SIngle live IUP at 7w6d. Measurements are consistent with LMP
 [DATE] provided. Technically limited exam due to early
 Gestational Age.
Impression

 IUP 7 weeks 6 days by CRL
Recommendations
 Fetal anatomy scan at 18-20 weeks.

## 2020-10-05 MED ORDER — PROMETHAZINE HCL 25 MG PO TABS: 25.0000 mg | ORAL_TABLET | Freq: Four times a day (QID) | ORAL | 2 refills | Status: DC | PRN

## 2020-10-05 NOTE — Progress Notes (Signed)
New OB Intake  I connected with  Dana Mcmillan on 10/05/20 at 10:15 AM EDT by in person. Video Visit and verified that I am speaking with the correct person using two identifiers. Nurse is located at [facility] and pt is located at [place].  I discussed the limitations, risks, security and privacy concerns of performing an evaluation and management service by telephone and the availability of in person appointments. I also discussed with the patient that there may be a patient responsible charge related to this service. The patient expressed understanding and agreed to proceed.  I explained I am completing New OB Intake today. We discussed her EDD of 05/14/21 that is based on LMP of 08/07/20. Pt is G1/P0. I reviewed her allergies, medications, Medical/Surgical/OB history, and appropriate screenings. I informed her of [facility] services. Based on history, this is a/an  pregnancy uncomplicated .   Patient Active Problem List   Diagnosis Date Noted   Encounter for supervision of normal first pregnancy in first trimester 10/05/2020    Concerns addressed today  Delivery Plans:  Plans to deliver at [facility]   MyChart/Babyscripts MyChart access verified. I explained pt will have some visits in office and some virtually. Babyscripts instructions given and order placed. Patient verifies receipt of registration text/e-mail. Account successfully created and app downloaded.  Blood Pressure Cuff  Patient has private insurance, but has access to a BP cuff at home. Explained after first prenatal appt pt will check weekly and document in Babyscripts.  Weight scale: Patient does have weight scale at home.  Anatomy US Explained first scheduled [place] will be around 19 weeks. Dating and viability scan performed today. Labs Discussed Avelina Laine genetic screening with patient. Would like both Panorama and Horizon drawn at new OB visit. Routine prenatal labs needed.  Covid Vaccine Patient has not covid vaccine.    Mother/ Baby Dyad Candidate?    If yes, offer as possibility  Informed patient of [facility] website  and placed link in her AVS.   Social Determinants of Health Food Insecurity: Patient denies food insecurity. WIC Referral: Patient is interested in referral to [facility].  Transportation: Patient denies transportation needs. Childcare: Discussed no children allowed at ultrasound appointments. Offered childcare services; patient declines childcare services at this time.  Send link to Pregnancy Navigators   Placed OB Box on problem list and updated  First visit review I reviewed new OB appt with pt. I explained she will have a pelvic exam, ob bloodwork with genetic screening, and PAP smear. Explained pt will be seen by Vonzella Nipple at first visit; encounter routed to appropriate provider. Explained that patient will be seen by pregnancy navigator following visit with provider. [facility] information placed in AVS.   Hamilton Capri, RN 10/05/2020  10:48 AM

## 2020-10-21 ENCOUNTER — Encounter: Payer: BLUE CROSS/BLUE SHIELD | Admitting: Medical

## 2020-11-01 ENCOUNTER — Inpatient Hospital Stay (HOSPITAL_COMMUNITY): Admit: 2020-11-01 | Payer: BLUE CROSS/BLUE SHIELD

## 2020-11-01 ENCOUNTER — Encounter: Payer: Self-pay | Admitting: Obstetrics and Gynecology

## 2020-11-01 ENCOUNTER — Other Ambulatory Visit: Payer: Self-pay

## 2020-11-01 ENCOUNTER — Telehealth: Payer: Self-pay

## 2020-11-01 ENCOUNTER — Ambulatory Visit (INDEPENDENT_AMBULATORY_CARE_PROVIDER_SITE_OTHER): Payer: BLUE CROSS/BLUE SHIELD | Admitting: Obstetrics and Gynecology

## 2020-11-01 VITALS — BP 111/73 | HR 98 | Wt 116.0 lb

## 2020-11-01 DIAGNOSIS — Z3401 Encounter for supervision of normal first pregnancy, first trimester: Secondary | ICD-10-CM

## 2020-11-01 DIAGNOSIS — Z3A12 12 weeks gestation of pregnancy: Secondary | ICD-10-CM

## 2020-11-01 DIAGNOSIS — O99891 Other specified diseases and conditions complicating pregnancy: Secondary | ICD-10-CM

## 2020-11-01 DIAGNOSIS — R8271 Bacteriuria: Secondary | ICD-10-CM | POA: Diagnosis not present

## 2020-11-01 DIAGNOSIS — Z8269 Family history of other diseases of the musculoskeletal system and connective tissue: Secondary | ICD-10-CM | POA: Diagnosis not present

## 2020-11-01 MED ORDER — BLOOD PRESSURE KIT: 1.0000 | PACK | 0 refills | Status: AC

## 2020-11-01 MED ORDER — DOXYLAMINE-PYRIDOXINE 10-10 MG PO TBEC: 2.0000 | DELAYED_RELEASE_TABLET | Freq: Every day | ORAL | 5 refills | Status: DC

## 2020-11-01 NOTE — Progress Notes (Signed)
History of recurrent UTI's

## 2020-11-01 NOTE — Telephone Encounter (Signed)
Called pt about insurance unable to leave a message vmail is full

## 2020-11-01 NOTE — Telephone Encounter (Signed)
Sent a message to pt in mychart to inform her of the insurance

## 2020-11-02 LAB — CBC/D/PLT+RPR+RH+ABO+RUBIGG...
Antibody Screen: NEGATIVE
Basophils Absolute: 0.1 10*3/uL (ref 0.0–0.2)
Basos: 1 %
EOS (ABSOLUTE): 0.2 10*3/uL (ref 0.0–0.4)
Eos: 2 %
HCV Ab: <0.1 s/co ratio (ref 0.0–0.9)
HIV Screen 4th Generation wRfx: NONREACTIVE
Hematocrit: 38.4 % (ref 34.0–46.6)
Hemoglobin: 13.4 g/dL (ref 11.1–15.9)
Hepatitis B Surface Ag: NEGATIVE
Immature Grans (Abs): 0 10*3/uL (ref 0.0–0.1)
Immature Granulocytes: 0 %
Lymphocytes Absolute: 2.5 10*3/uL (ref 0.7–3.1)
Lymphs: 25 %
MCH: 31.6 pg (ref 26.6–33.0)
MCHC: 34.9 g/dL (ref 31.5–35.7)
MCV: 91 fL (ref 79–97)
Monocytes Absolute: 0.6 10*3/uL (ref 0.1–0.9)
Monocytes: 6 %
Neutrophils Absolute: 6.6 10*3/uL (ref 1.4–7.0)
Neutrophils: 66 %
Platelets: 256 10*3/uL (ref 150–450)
RBC: 4.24 x10E6/uL (ref 3.77–5.28)
RDW: 11.7 % (ref 11.7–15.4)
RPR Ser Ql: NONREACTIVE
Rh Factor: POSITIVE
Rubella Antibodies, IGG: 2.04 index (ref >0.99)
WBC: 10 10*3/uL (ref 3.4–10.8)

## 2020-11-02 LAB — HCV INTERPRETATION

## 2020-11-03 DIAGNOSIS — Z8269 Family history of other diseases of the musculoskeletal system and connective tissue: Secondary | ICD-10-CM | POA: Insufficient documentation

## 2020-11-03 LAB — CYTOLOGY - PAP
Chlamydia: NEGATIVE
Comment: NEGATIVE
Comment: NORMAL
Diagnosis: NEGATIVE
Neisseria Gonorrhea: NEGATIVE

## 2020-11-03 NOTE — Progress Notes (Signed)
New OB Note  11/01/2020   Clinic: [facility]  Chief Complaint: New OB  Transfer of Care Patient: no  History of Present Illness: Dana Mcmillan is a 25 y.o. G1 @ 12/2 weeks (EDC 4-22, based on Patient's last menstrual period was 08/07/2020.=7wk u/s).  Preg complicated by has Encounter for supervision of normal first pregnancy in first trimester on their problem list.   Any events prior to today's visit: no Her periods were: qmonth, regular She was using no method when she conceived.  She has moderate signs or symptoms of nausea/vomiting of pregnancy. Phenergan helps but makes her pretty sleepy She has Negative signs or symptoms of miscarriage or preterm labor On any medications around the time she conceived/early pregnancy: No   ROS: A 12-point review of systems was performed and negative, except as stated in the above HPI.  OBGYN History: As per HPI. OB History  Gravida Para Term Preterm AB Living  1            SAB IAB Ectopic Multiple Live Births               # Outcome Date GA Lbr Len/2nd Weight Sex Delivery Anes PTL Lv  1 Current            Any issues with any prior pregnancies: not applicable Prior children are healthy, doing well, and without any problems or issues: not applicable History of pap smears: No.   Past Medical History: Past Medical History:  Diagnosis Date   Anxiety    MDD (major depressive disorder)    Panic disorder with agoraphobia and moderate panic attacks     Past Surgical History: Past Surgical History:  Procedure Laterality Date   ANKLE SURGERY Right 09/2019   WISDOM TOOTH EXTRACTION  2017    Family History:  Family History  Problem Relation Age of Onset   Prostate cancer Paternal Grandfather    She reports any history of mental retardation, birth defects or genetic disorders in her or the FOB's history: FOB with h/o clubfoot in the family.   Social History:  Social History   Socioeconomic History    Marital status: Single    Spouse name: Not on file   Number of children: Not on file   Years of education: Not on file   Highest education level: Not on file  Occupational History   Not on file  Tobacco Use   Smoking status: Every Day    Types: Cigarettes    Passive exposure: Never   Smokeless tobacco: Never  Vaping Use   Vaping Use: Every day  Substance and Sexual Activity   Alcohol use: Not Currently    Comment: not since confirmed pregnancy   Drug use: Not Currently    Comment: not since confirmed pregnancy   Sexual activity: Yes    Partners: Male    Birth control/protection: None  Other Topics Concern   Not on file  Social History Narrative   Not on file   Social Determinants of Health   Financial Resource Strain: Not on file  Food Insecurity: Not on file  Transportation Needs: Not on file  Physical Activity: Not on file  Stress: Not on file  Social Connections: Not on file  Intimate Partner Violence: Not on file    Allergy: Allergies  Allergen Reactions   Latex    Other Rash    Current Outpatient Medications: Prenatal Phenergan  Physical Exam:   BP 111/73   Pulse 98  Wt 116 lb (52.6 kg)   LMP 08/07/2020   BMI 18.72 kg/m  Body mass index is 18.72 kg/m. Contractions: Not present Vag. Bleeding: None. FHTs: 160s  General appearance: Well nourished, well developed female in no acute distress.  Neck:  Supple, normal appearance, and no thyromegaly  Cardiovascular: S1, S2 normal, no murmur, rub or gallop, regular rate and rhythm Respiratory:  Clear to auscultation bilateral. Normal respiratory effort Abdomen: positive bowel sounds and no masses, hernias; diffusely non tender to palpation, non distended Breasts: pt denies any breast s/s. Neuro/Psych:  Normal mood and affect.  Skin:  Warm and dry.  Lymphatic:  No inguinal lymphadenopathy.   Pelvic exam: is not limited by body habitus EGBUS: within normal limits, Vagina: within normal limits and  with no blood in the vault, Cervix: normal appearing cervix without discharge or lesions, closed/long/high, Uterus:  enlarged, c/w 12-14 week size, and Adnexa:  normal adnexa and no mass, fullness, tenderness  Laboratory: none  Imaging:  Narrative & Impression  ----------------------------------------------------------------------  OBSTETRICS REPORT                         (Signed Final 10/05/2020 01:36 pm) ---------------------------------------------------------------------- Patient Info  ID #:        [id number]                          D.O.B.:  1995-12-02 (25 yrs)  Name:        Dana Mcmillan                 Visit Date: 10/05/2020 11:12 am ---------------------------------------------------------------------- Performed By  Attending:         Scheryl Darter MD        Ref. Address:      [address]                                                               [place], [place]                                                               [address]  Performed By:      Angelica Pou RN         Location:          [facility]                                                               [place]  Referred By:       Adam Phenix                     MD ---------------------------------------------------------------------- Orders  #   Description                          Code         Ordered By  1   US OB LIMITED                        76815.0      Scheryl Darter ----------------------------------------------------------------------  #   Order #                    Accession #                 Episode #  1   [id number]                  [id number]                  [id number] ---------------------------------------------------------------------- Indications  Less than [redacted] weeks gestation of pregnancy         Z3A.01 ---------------------------------------------------------------------- Fetal Evaluation  Num Of Fetuses:          1  Fetal Heart Rate(bpm):   166  Cardiac Activity:        Observed ---------------------------------------------------------------------- Biometry  CRL:      15.1   mm     G. Age:  7w 6d                    EDD:   05/18/21 ---------------------------------------------------------------------- Gestational Age  Best:           7w 6d     Det. By:  U/S C R L (10/05/20)       EDD:  05/18/21 ---------------------------------------------------------------------- Comments  SIngle live IUP at [redacted]w[redacted]d. Measurements are consistent with LMP  08/07/20 provided. Technically limited exam due to early  Gestational Age. ---------------------------------------------------------------------- Impression  IUP 7 weeks 6 days by CRL ---------------------------------------------------------------------- Recommendations  Fetal anatomy scan at 18-20 weeks. ----------------------------------------------------------------------                   Scheryl Darter, MD Electronically Signed Final Report   10/05/2020 01:36 pm    Assessment: patient doing well  Plan: 1. Encounter for supervision of normal first pregnancy in first trimester Diclegis sent in Weight goals d/w pt and to let us know if diclegis doesn't help and/or weight drops.  Offer afp nv - CBC/D/Plt+RPR+Rh+ABO+RubIgG... - Culture, OB Urine - Genetic Screening - Cytology - PAP - [place] MFM OB COMP + 14 WK; Future  Problem list reviewed and updated.  Follow up in 3-4 weeks.   >50% of 35 min visit spent on counseling and coordination of care.     Cornelia Copa MD Attending [facility])

## 2020-11-04 LAB — URINE CULTURE, OB REFLEX

## 2020-11-04 LAB — CULTURE, OB URINE

## 2020-11-05 DIAGNOSIS — R8271 Bacteriuria: Secondary | ICD-10-CM | POA: Insufficient documentation

## 2020-11-05 DIAGNOSIS — O99891 Other specified diseases and conditions complicating pregnancy: Secondary | ICD-10-CM | POA: Insufficient documentation

## 2020-11-05 MED ORDER — CEFADROXIL 500 MG PO CAPS: 500.0000 mg | ORAL_CAPSULE | Freq: Two times a day (BID) | ORAL | 0 refills | Status: DC

## 2020-11-05 NOTE — Addendum Note (Signed)
Addended by: [place] Bing on: 11/05/2020 08:30 AM   Modules accepted: Orders

## 2020-11-08 ENCOUNTER — Encounter: Payer: Self-pay | Admitting: Obstetrics and Gynecology

## 2020-11-09 ENCOUNTER — Inpatient Hospital Stay (HOSPITAL_COMMUNITY)
Admission: AD | Admit: 2020-11-09 | Discharge: 2020-11-09 | Disposition: A | Discharge status: Home / Self Care | Payer: BLUE CROSS/BLUE SHIELD | Attending: Obstetrics and Gynecology | Admitting: Obstetrics and Gynecology

## 2020-11-09 ENCOUNTER — Other Ambulatory Visit: Payer: Self-pay

## 2020-11-09 ENCOUNTER — Encounter (HOSPITAL_COMMUNITY): Payer: Self-pay | Admitting: Obstetrics and Gynecology

## 2020-11-09 DIAGNOSIS — O99331 Smoking (tobacco) complicating pregnancy, first trimester: Secondary | ICD-10-CM | POA: Diagnosis not present

## 2020-11-09 DIAGNOSIS — O26891 Other specified pregnancy related conditions, first trimester: Secondary | ICD-10-CM | POA: Insufficient documentation

## 2020-11-09 DIAGNOSIS — F17210 Nicotine dependence, cigarettes, uncomplicated: Secondary | ICD-10-CM | POA: Insufficient documentation

## 2020-11-09 DIAGNOSIS — R197 Diarrhea, unspecified: Secondary | ICD-10-CM | POA: Insufficient documentation

## 2020-11-09 DIAGNOSIS — Z3401 Encounter for supervision of normal first pregnancy, first trimester: Secondary | ICD-10-CM

## 2020-11-09 DIAGNOSIS — Z3A13 13 weeks gestation of pregnancy: Secondary | ICD-10-CM | POA: Diagnosis not present

## 2020-11-09 DIAGNOSIS — R112 Nausea with vomiting, unspecified: Secondary | ICD-10-CM

## 2020-11-09 DIAGNOSIS — K529 Noninfective gastroenteritis and colitis, unspecified: Secondary | ICD-10-CM

## 2020-11-09 DIAGNOSIS — O219 Vomiting of pregnancy, unspecified: Secondary | ICD-10-CM

## 2020-11-09 DIAGNOSIS — O210 Mild hyperemesis gravidarum: Secondary | ICD-10-CM | POA: Diagnosis not present

## 2020-11-09 LAB — CBC
HCT: 39.6 % (ref 36.0–46.0)
Hemoglobin: 14 g/dL (ref 12.0–15.0)
MCH: 31 pg (ref 26.0–34.0)
MCHC: 35.4 g/dL (ref 30.0–36.0)
MCV: 87.8 fL (ref 80.0–100.0)
Platelets: 326 10*3/uL (ref 150–400)
RBC: 4.51 MIL/uL (ref 3.87–5.11)
RDW: 11.9 % (ref 11.5–15.5)
WBC: 19.3 10*3/uL — ABNORMAL HIGH (ref 4.0–10.5)
nRBC: 0 % (ref 0.0–0.2)

## 2020-11-09 LAB — COMPREHENSIVE METABOLIC PANEL
ALT: 15 U/L (ref 0–44)
AST: 25 U/L (ref 15–41)
Albumin: 4.3 g/dL (ref 3.5–5.0)
Alkaline Phosphatase: 55 U/L (ref 38–126)
Anion gap: 16 — ABNORMAL HIGH (ref 5–15)
BUN: 8 mg/dL (ref 6–20)
CO2: 19 mmol/L — ABNORMAL LOW (ref 22–32)
Calcium: 10 mg/dL (ref 8.9–10.3)
Chloride: 100 mmol/L (ref 98–111)
Creatinine, Ser: 0.71 mg/dL (ref 0.44–1.00)
GFR, Estimated: >60 mL/min (ref >60)
Glucose, Bld: 117 mg/dL — ABNORMAL HIGH (ref 70–99)
Potassium: 3.1 mmol/L — ABNORMAL LOW (ref 3.5–5.1)
Sodium: 135 mmol/L (ref 135–145)
Total Bilirubin: 1 mg/dL (ref 0.3–1.2)
Total Protein: 8.1 g/dL (ref 6.5–8.1)

## 2020-11-09 MED ORDER — FAMOTIDINE IN NACL 20-0.9 MG/50ML-% IV SOLN
20.0000 mg | Freq: Once | INTRAVENOUS | Status: AC
  Administered 2020-11-09: 20 mg via INTRAVENOUS
  Filled 2020-11-09: qty 50

## 2020-11-09 MED ORDER — SODIUM CHLORIDE 0.9 % IV SOLN
25.0000 mg | Freq: Once | INTRAVENOUS | Status: AC
  Administered 2020-11-09: 25 mg via INTRAVENOUS
  Filled 2020-11-09: qty 1

## 2020-11-09 MED ORDER — ONDANSETRON HCL 4 MG/2ML IJ SOLN
4.0000 mg | Freq: Once | INTRAMUSCULAR | Status: AC
  Administered 2020-11-09: 4 mg via INTRAVENOUS
  Filled 2020-11-09: qty 2

## 2020-11-09 MED ORDER — ONDANSETRON 4 MG PO TBDP: 4.0000 mg | ORAL_TABLET | Freq: Three times a day (TID) | ORAL | 0 refills | Status: DC | PRN

## 2020-11-09 NOTE — MAU Note (Signed)
Worsening vomiting, can't keep anything down. Meds aren't working.  Chest pain- burning from throwing up all day.   Having diarrhea, if she doesn't throw up, it comes out the other end

## 2020-11-09 NOTE — MAU Note (Signed)
Pt had large bowl with her, ~500cc colored liquid, scan undigested food in it.  Unable to urinate at this time, "hasn't really gone all day".

## 2020-11-09 NOTE — MAU Note (Signed)
Received 25 y/o, G1P0 13 3/7 weeks, pt c/o N/V/D since 9pm yesterday with no relief, reports pain 9/10 to mid to upper chest with some burning. Pt denies vaginal bleeding or leakage of fluid, safety maintained.

## 2020-11-09 NOTE — MAU Provider Note (Addendum)
Chief Complaint:  Emesis and Heartburn    HPI: Dana Mcmillan is a 25 y.o. G1P0 at 34w3dwho presents to maternity admissions reporting nonstop nausea and vomiting since yesterday. Reports she has "thrown up over 100 times and can't keep anything down". She reports that she has only peed once today. She took 1 promethazine this morning but it did not help, so has not taken any more. She reports a burning sensation in her upper stomach and chest. She is also reporting having 3 BM's today, however "unsure if it was diarrhea, loose, or normal". She denies fever, chills, known sick contacts, or vaginal bleeding.   Pregnancy Course:   Past Medical History:  Diagnosis Date   Anxiety    MDD (major depressive disorder)    Panic disorder with agoraphobia and moderate panic attacks    OB History  Gravida Para Term Preterm AB Living  1            SAB IAB Ectopic Multiple Live Births               # Outcome Date GA Lbr Len/2nd Weight Sex Delivery Anes PTL Lv  1 Current            Past Surgical History:  Procedure Laterality Date   ANKLE SURGERY Right 09/2019   WISDOM TOOTH EXTRACTION  2017   Family History  Problem Relation Age of Onset   Prostate cancer Paternal Grandfather    Social History   Tobacco Use   Smoking status: Every Day    Types: Cigarettes    Passive exposure: Never   Smokeless tobacco: Never  Vaping Use   Vaping Use: Every day  Substance Use Topics   Alcohol use: Not Currently    Comment: not since confirmed pregnancy   Drug use: Not Currently    Comment: not since confirmed pregnancy   Allergies  Allergen Reactions   Latex    Other Rash   Medications Prior to Admission  Medication Sig Dispense Refill Last Dose   Prenatal MV & Min w/FA-DHA (PRENATAL GUMMIES PO) Take by mouth.   11/08/2020   promethazine (PHENERGAN) 25 MG tablet Take 1 tablet (25 mg total) by mouth every 6 (six) hours as needed for nausea or vomiting. 30 tablet 2 11/09/2020   Blood Pressure  KIT 1 Device by Does not apply route once a week. To be monitored from home 1 kit 0    cefadroxil (DURICEF) 500 MG capsule Take 1 capsule (500 mg total) by mouth 2 (two) times daily for 5 days. 10 capsule 0 Unknown   Doxylamine-Pyridoxine (DICLEGIS) 10-10 MG TBEC Take 2 tablets by mouth at bedtime. If symptoms persist, add one tablet in the morning and one in the afternoon 100 tablet 5 Unknown   I have reviewed patient's Past Medical Hx, Surgical Hx, Family Hx, Social Hx, medications and allergies.   ROS:  Review of Systems  Constitutional:  Positive for appetite change. Negative for fever.  Respiratory: Negative.    Cardiovascular: Negative.   Gastrointestinal:  Positive for abdominal pain, nausea and vomiting.       Burning in upper abdomen/chest   Genitourinary: Negative.   Neurological: Negative.    Physical Exam  Patient Vitals for the past 24 hrs:  BP Temp Temp src Pulse Resp SpO2 Height Weight  11/09/20 1904 93/64 98 F (36.7 C) Oral 74 18 -- -- --  11/09/20 1839 -- 98.1 F (36.7 C) Oral 88 (!) 22 100 % 5'  6" (1.676 m) 50.8 kg   Constitutional: well-developed, well-nourished female in no acute distress.  Cardiovascular: normal rate Respiratory: normal effort GI: abd soft, non-tender MS: extremities nontender, no edema, normal ROM Neurologic: alert and oriented x 4.  Pelvic: not indicated     FHT: 160bpm via doppler    Labs: No results found for this or any previous visit (from the past 24 hour(s)).  Imaging:  No results found.  MAU Course: Orders Placed This Encounter  Procedures   Urinalysis, Routine w reflex microscopic Urine, Clean Catch   CBC   Comprehensive metabolic panel   Contact Isolation: Enteric   Meds ordered this encounter  Medications   promethazine (PHENERGAN) 25 mg in sodium chloride 0.9 % 1,000 mL infusion   famotidine (PEPCID) IVPB 20 mg premix    MDM: IVF bolus with Phenergan Pepcid IV CBC, CMP UA Care handed over to Hansel Feinstein, CNM at 2100    Renee Harder, CNM 11/09/2020 9:01 PM  Felt better after fluids but vomited again Zofran given which did alleviate her nausea Pt stated she was tired and ready to go home I will prescribe Zofran as a backup in case Promethazine does not work  A:  SIngle iUP at [redacted]w[redacted]d      New Nausea/vomiting/diarrhea       Probable gastroenteritis  P:  Discharge home      Advance diet as tolerated       Has rx for Phenergan      New Rx for Zofran for use if phenergan does not work      Encouraged to return if she develops worsening of symptoms, increase in pain, fever, or other concerning symptoms.   WSeabron Spates CNM

## 2020-11-10 ENCOUNTER — Telehealth: Payer: Self-pay

## 2020-11-10 NOTE — Telephone Encounter (Signed)
Patient has not viewed results from visit on 11/01/20 in MyChart. Called pt to review results and provider recommendation. VM left for patient to review results in MyChart and pick up new medications.

## 2020-11-15 ENCOUNTER — Other Ambulatory Visit (INDEPENDENT_AMBULATORY_CARE_PROVIDER_SITE_OTHER): Payer: Self-pay

## 2020-11-16 ENCOUNTER — Other Ambulatory Visit: Payer: Self-pay

## 2020-11-16 DIAGNOSIS — O219 Vomiting of pregnancy, unspecified: Secondary | ICD-10-CM

## 2020-11-16 DIAGNOSIS — Z3401 Encounter for supervision of normal first pregnancy, first trimester: Secondary | ICD-10-CM

## 2020-11-16 DIAGNOSIS — R8271 Bacteriuria: Secondary | ICD-10-CM

## 2020-11-16 DIAGNOSIS — O99891 Other specified diseases and conditions complicating pregnancy: Secondary | ICD-10-CM

## 2020-11-16 MED ORDER — CEFADROXIL 500 MG PO CAPS: 500.0000 mg | ORAL_CAPSULE | Freq: Two times a day (BID) | ORAL | 0 refills | Status: AC

## 2020-11-16 MED ORDER — ONDANSETRON 4 MG PO TBDP: 4.0000 mg | ORAL_TABLET | Freq: Three times a day (TID) | ORAL | 0 refills | Status: DC | PRN

## 2020-11-16 NOTE — Progress Notes (Signed)
Pt requesting refill on Zofran Rx ok per Dr.Constant to refill . Pt made aware and voiced understanding.

## 2020-11-16 NOTE — Progress Notes (Signed)
Pt Rx for UTI resent to pt pharmacy of preference [facility].

## 2020-11-22 ENCOUNTER — Other Ambulatory Visit: Payer: Self-pay

## 2020-11-22 DIAGNOSIS — O219 Vomiting of pregnancy, unspecified: Secondary | ICD-10-CM

## 2020-11-23 MED ORDER — ONDANSETRON 4 MG PO TBDP
4.0000 mg | ORAL_TABLET | Freq: Three times a day (TID) | ORAL | 0 refills | Status: DC | PRN
Start: 2020-11-23 — End: 2020-12-01

## 2020-11-29 ENCOUNTER — Encounter: Payer: Self-pay | Admitting: Obstetrics & Gynecology

## 2020-11-29 ENCOUNTER — Ambulatory Visit (INDEPENDENT_AMBULATORY_CARE_PROVIDER_SITE_OTHER): Payer: BLUE CROSS/BLUE SHIELD | Admitting: Obstetrics & Gynecology

## 2020-11-29 ENCOUNTER — Other Ambulatory Visit: Payer: Self-pay

## 2020-11-29 VITALS — BP 115/71 | HR 74 | Wt 114.0 lb

## 2020-11-29 DIAGNOSIS — R8271 Bacteriuria: Secondary | ICD-10-CM

## 2020-11-29 DIAGNOSIS — O219 Vomiting of pregnancy, unspecified: Secondary | ICD-10-CM

## 2020-11-29 DIAGNOSIS — O99891 Other specified diseases and conditions complicating pregnancy: Secondary | ICD-10-CM

## 2020-11-29 DIAGNOSIS — B359 Dermatophytosis, unspecified: Secondary | ICD-10-CM

## 2020-11-29 DIAGNOSIS — O280 Abnormal hematological finding on antenatal screening of mother: Secondary | ICD-10-CM

## 2020-11-29 DIAGNOSIS — Z3A16 16 weeks gestation of pregnancy: Secondary | ICD-10-CM

## 2020-11-29 DIAGNOSIS — Z3402 Encounter for supervision of normal first pregnancy, second trimester: Secondary | ICD-10-CM

## 2020-11-29 MED ORDER — SCOPOLAMINE 1 MG/3DAYS TD PT72: 1.0000 | MEDICATED_PATCH | TRANSDERMAL | 12 refills | Status: DC

## 2020-11-29 MED ORDER — CLOTRIMAZOLE 1 % EX CREA: 1.0000 "application " | TOPICAL_CREAM | Freq: Two times a day (BID) | CUTANEOUS | 0 refills | Status: AC

## 2020-11-29 NOTE — Progress Notes (Signed)
   PRENATAL VISIT NOTE  Subjective:  Dana Mcmillan is a 25 y.o. G1P0 at [redacted]w[redacted]d being seen today for ongoing prenatal care.  She is currently monitored for the following issues for this low-risk pregnancy and has Encounter for supervision of normal first pregnancy in first trimester; Family history of clubfoot in FOB; and Asymptomatic bacteriuria during pregnancy in first trimester on their problem list.  Patient reports  ringworm lesion on breast   Also continued nausea, helped with Zofran but wants to try another non-sedating or constipating medication.  Contractions: Not present. Vag. Bleeding: None.  Movement: Absent. Denies leaking of fluid.   The following portions of the patient's history were reviewed and updated as appropriate: allergies, current medications, past family history, past medical history, past social history, past surgical history and problem list.   Objective:   Vitals:   11/29/20 1527  BP: 115/71  Pulse: 74  Weight: 114 lb (51.7 kg)    Fetal Status: Fetal Heart Rate (bpm): 152   Movement: Absent     General:  Alert, oriented and cooperative. Patient is in no acute distress.  Skin: Skin is warm and dry. 2 cm ringworm lesion noted on medical surface of left breast.  Cardiovascular: Normal heart rate noted  Respiratory: Normal respiratory effort, no problems with respiration noted  Abdomen: Soft, gravid, appropriate for gestational age.  Pain/Pressure: Absent     Pelvic: Cervical exam deferred        Extremities: Normal range of motion.  Edema: None  Mental Status: Normal mood and affect. Normal behavior. Normal judgment and thought content.   Assessment and Plan:  Pregnancy: G1P0 at [redacted]w[redacted]d 1. Nausea/vomiting in pregnancy Continue Zofran, stool softeners recommended. Also prescribed Scopolamine patch.  - scopolamine (TRANSDERM-SCOP) 1 MG/3DAYS; Place 1 patch (1.5 mg total) onto the skin every 3 (three) days.  Dispense: 10 patch; Refill: 12  2.  Ringworm Lotrimin cream prescribed for ringworm. - clotrimazole (LOTRIMIN) 1 % cream; Apply 1 application topically 2 (two) times daily for 14 days.  Dispense: 60 g; Refill: 0  3. Asymptomatic bacteriuria during pregnancy in first trimester - Culture, OB Urine sent for test of cure  4. [redacted] weeks gestation of pregnancy 5. Encounter for supervision of normal first pregnancy in second trimester Low risk NIPS. Already scheduled for anatomy scan.  - AFP, Serum, Open Spina Bifida No other complaints or concerns.  Routine obstetric precautions reviewed.  Please refer to After Visit Summary for other counseling recommendations.   Return in about 4 weeks (around 12/27/2020) for OFFICE OB VISIT (MD or APP).  Future Appointments  Date Time Provider Department Center  12/20/2020  2:15 PM [facility]  12/27/2020  3:10 PM [facility]il[facility] Southern, Jethro Bastos, MD [facility]    Jaynie Collins, MD

## 2020-11-29 NOTE — Patient Instructions (Signed)

## 2020-11-30 LAB — AFP, SERUM, OPEN SPINA BIFIDA
AFP MoM: 6.13
AFP Value: 268.8 ng/mL
Gest. Age on Collection Date: 16.3 weeks
Maternal Age At EDD: 23.9 yr
OSBR Risk 1 IN: 10
Test Results:: POSITIVE — AB
Weight: 114 [lb_av]

## 2020-12-01 ENCOUNTER — Encounter: Payer: Self-pay | Admitting: Obstetrics & Gynecology

## 2020-12-01 ENCOUNTER — Other Ambulatory Visit: Payer: Self-pay

## 2020-12-01 ENCOUNTER — Telehealth: Payer: Self-pay | Admitting: *Deleted

## 2020-12-01 DIAGNOSIS — O219 Vomiting of pregnancy, unspecified: Secondary | ICD-10-CM

## 2020-12-01 DIAGNOSIS — R772 Abnormality of alphafetoprotein: Secondary | ICD-10-CM | POA: Insufficient documentation

## 2020-12-01 DIAGNOSIS — O280 Abnormal hematological finding on antenatal screening of mother: Secondary | ICD-10-CM | POA: Insufficient documentation

## 2020-12-01 NOTE — Telephone Encounter (Signed)
-----   Message from Tereso Newcomer, MD sent at 12/01/2020 12:44 PM EST ----- This patient is at increased risk (1:10) to have a baby with open spina bifida (OSB). Needs a detailed MFM anatomy scan, this was ordered. Please call to inform patient of results and recommendations.

## 2020-12-01 NOTE — Telephone Encounter (Signed)
Called pt and informed her of AFP results and recommendations.

## 2020-12-01 NOTE — Addendum Note (Signed)
Addended by: Jaynie Collins A on: 12/01/2020 12:44 PM   Modules accepted: Orders

## 2020-12-02 LAB — CULTURE, OB URINE

## 2020-12-02 LAB — URINE CULTURE, OB REFLEX

## 2020-12-02 MED ORDER — ONDANSETRON 4 MG PO TBDP: 4.0000 mg | ORAL_TABLET | Freq: Three times a day (TID) | ORAL | 0 refills | Status: DC | PRN

## 2020-12-10 ENCOUNTER — Other Ambulatory Visit: Payer: Self-pay | Admitting: Obstetrics & Gynecology

## 2020-12-10 DIAGNOSIS — O219 Vomiting of pregnancy, unspecified: Secondary | ICD-10-CM

## 2020-12-11 ENCOUNTER — Inpatient Hospital Stay (HOSPITAL_COMMUNITY)
Admission: AD | Admit: 2020-12-11 | Discharge: 2020-12-11 | Disposition: A | Discharge status: Home / Self Care | Payer: BLUE CROSS/BLUE SHIELD | Attending: Family Medicine | Admitting: Family Medicine

## 2020-12-11 ENCOUNTER — Other Ambulatory Visit: Payer: Self-pay

## 2020-12-11 ENCOUNTER — Encounter (HOSPITAL_COMMUNITY): Payer: Self-pay | Admitting: Family Medicine

## 2020-12-11 DIAGNOSIS — O219 Vomiting of pregnancy, unspecified: Secondary | ICD-10-CM | POA: Diagnosis not present

## 2020-12-11 DIAGNOSIS — O99612 Diseases of the digestive system complicating pregnancy, second trimester: Secondary | ICD-10-CM | POA: Insufficient documentation

## 2020-12-11 DIAGNOSIS — K5900 Constipation, unspecified: Secondary | ICD-10-CM | POA: Insufficient documentation

## 2020-12-11 DIAGNOSIS — Z3A18 18 weeks gestation of pregnancy: Secondary | ICD-10-CM

## 2020-12-11 DIAGNOSIS — R12 Heartburn: Secondary | ICD-10-CM | POA: Diagnosis not present

## 2020-12-11 DIAGNOSIS — Z79899 Other long term (current) drug therapy: Secondary | ICD-10-CM | POA: Diagnosis not present

## 2020-12-11 DIAGNOSIS — Z3402 Encounter for supervision of normal first pregnancy, second trimester: Secondary | ICD-10-CM

## 2020-12-11 MED ORDER — ONDANSETRON 4 MG PO TBDP
8.0000 mg | ORAL_TABLET | Freq: Once | ORAL | Status: AC
  Administered 2020-12-11: 8 mg via ORAL
  Filled 2020-12-11: qty 2

## 2020-12-11 MED ORDER — PANTOPRAZOLE SODIUM 40 MG PO TBEC
40.0000 mg | DELAYED_RELEASE_TABLET | Freq: Once | ORAL | Status: AC
  Administered 2020-12-11: 40 mg via ORAL
  Filled 2020-12-11: qty 1

## 2020-12-11 MED ORDER — SCOPOLAMINE 1 MG/3DAYS TD PT72
1.0000 | MEDICATED_PATCH | Freq: Once | TRANSDERMAL | Status: DC
  Administered 2020-12-11: 1.5 mg via TRANSDERMAL
  Filled 2020-12-11: qty 1

## 2020-12-11 MED ORDER — ONDANSETRON 4 MG PO TBDP: 4.0000 mg | ORAL_TABLET | Freq: Four times a day (QID) | ORAL | 0 refills | Status: DC | PRN

## 2020-12-11 MED ORDER — ONDANSETRON 4 MG PO TBDP: 4.0000 mg | ORAL_TABLET | Freq: Four times a day (QID) | ORAL | 2 refills | Status: DC | PRN

## 2020-12-11 NOTE — MAU Provider Note (Addendum)
History     CSN: [id number]  Arrival date and time: 12/11/20 5400   Event Date/Time   First Provider Initiated Contact with Patient 12/11/20 1901      Chief Complaint  Patient presents with   Emesis   Nausea   HPI Dana Mcmillan is a 25 y.o. G1P0 at 79w0dwho presents with nausea & vomiting. This has been an ongoing issue during the pregnancy. Had been well controlled on zofran but ran out of her meds yesterday. Reports countless vomiting episodes today. Also reports heartburn. Hasn't been able to treat symptoms today. Has been having issues with constipation while taking zofran - started taking an unknown med to treat her constipation the other day.  Denies abdominal pain or vaginal bleeding.   OB History     Gravida  1   Para      Term      Preterm      AB      Living         SAB      IAB      Ectopic      Multiple      Live Births              Past Medical History:  Diagnosis Date   Anxiety    MDD (major depressive disorder)    Panic disorder with agoraphobia and moderate panic attacks     Past Surgical History:  Procedure Laterality Date   ANKLE SURGERY Right 09/2019   WISDOM TOOTH EXTRACTION  2017    Family History  Problem Relation Age of Onset   Prostate cancer Paternal Grandfather     Social History   Tobacco Use   Smoking status: Former    Types: Cigarettes    Passive exposure: Never   Smokeless tobacco: Never  Vaping Use   Vaping Use: Every day  Substance Use Topics   Alcohol use: Not Currently    Comment: not since confirmed pregnancy   Drug use: Not Currently    Comment: not since confirmed pregnancy    Allergies:  Allergies  Allergen Reactions   Latex    Other Rash    Medications Prior to Admission  Medication Sig Dispense Refill Last Dose   clotrimazole (LOTRIMIN) 1 % cream Apply 1 application topically 2 (two) times daily for 14 days. 60 g 0 12/11/2020   ondansetron (ZOFRAN ODT) 4 MG disintegrating tablet  Take 1 tablet (4 mg total) by mouth every 8 (eight) hours as needed for nausea or vomiting. If Promethazine does not work 20 tablet 0 12/10/2020   Prenatal MV & Min w/FA-DHA (PRENATAL GUMMIES PO) Take by mouth.   12/11/2020   Blood Pressure KIT 1 Device by Does not apply route once a week. To be monitored from home 1 kit 0    Doxylamine-Pyridoxine (DICLEGIS) 10-10 MG TBEC Take 2 tablets by mouth at bedtime. If symptoms persist, add one tablet in the morning and one in the afternoon (Patient not taking: Reported on 11/29/2020) 100 tablet 5    promethazine (PHENERGAN) 25 MG tablet Take 1 tablet (25 mg total) by mouth every 6 (six) hours as needed for nausea or vomiting. (Patient not taking: Reported on 11/29/2020) 30 tablet 2    scopolamine (TRANSDERM-SCOP) 1 MG/3DAYS Place 1 patch (1.5 mg total) onto the skin every 3 (three) days. 10 patch 12     Review of Systems  Constitutional: Negative.   Gastrointestinal:  Positive for constipation, nausea and vomiting.  Negative for abdominal pain and diarrhea.  Genitourinary: Negative.   Physical Exam   Blood pressure 123/82, pulse 70, temperature 97.8 F (36.6 C), temperature source Oral, resp. rate 20, last menstrual period 08/07/2020, SpO2 100 %.  Physical Exam Vitals and nursing note reviewed.  Constitutional:      General: She is in acute distress (pt writhing in bed & actively vomiting).     Appearance: She is not toxic-appearing.  HENT:     Head: Normocephalic and atraumatic.     Mouth/Throat:     Mouth: Mucous membranes are dry.  Neurological:     General: No focal deficit present.     Mental Status: She is alert.  Psychiatric:        Mood and Affect: Mood normal.        Behavior: Behavior normal.    MAU Course  Procedures No results found for this or any previous visit (from the past 24 hour(s)).  MDM FHT present via doppler Patient actively vomiting in MAU. Offered IV fluids but she declines. Requesting zofran odt. Zofran odt 8 mg  ordered as well as scopolamine patch. She is also requesting something for heartburn, protonix ordered. Patient will let us know if she changes mind about IV fluids.   Care turned over to [place], [place], NP 12/11/2020 8:11 PM  Assessment and Plan   Reassessment (8:54 PM) -Patient reports nausea remains. -Offered and refused additional interventions. -Discussed sending limited script to 24 hour pharmacy -Full script sent to preferred pharmacy as well. -Rx for Zofran 56m ODT  -Encouraged to call primary office or return to MAU if symptoms worsen or with the onset of new symptoms. -Discharged to home in stable condition.  JMaryann ConnersMSN, CNM Advanced Practice Provider, [facility] for [facility]

## 2020-12-11 NOTE — MAU Note (Signed)
Pt requesting medication for pain from repetitive vomiting.

## 2020-12-11 NOTE — MAU Note (Signed)
Presents stating she's been vomiting all day, unable to keep anything down.  States takes Zofran but ran out last night.  Denies VB or LOF

## 2020-12-12 ENCOUNTER — Inpatient Hospital Stay (HOSPITAL_COMMUNITY)
Admission: AD | Admit: 2020-12-12 | Discharge: 2020-12-12 | Disposition: A | Discharge status: Home / Self Care | Payer: BLUE CROSS/BLUE SHIELD | Attending: Obstetrics & Gynecology | Admitting: Obstetrics & Gynecology

## 2020-12-12 ENCOUNTER — Encounter (HOSPITAL_COMMUNITY): Payer: Self-pay | Admitting: Obstetrics & Gynecology

## 2020-12-12 DIAGNOSIS — K219 Gastro-esophageal reflux disease without esophagitis: Secondary | ICD-10-CM | POA: Diagnosis not present

## 2020-12-12 DIAGNOSIS — O210 Mild hyperemesis gravidarum: Secondary | ICD-10-CM | POA: Insufficient documentation

## 2020-12-12 DIAGNOSIS — O99612 Diseases of the digestive system complicating pregnancy, second trimester: Secondary | ICD-10-CM | POA: Insufficient documentation

## 2020-12-12 DIAGNOSIS — Z87891 Personal history of nicotine dependence: Secondary | ICD-10-CM | POA: Diagnosis not present

## 2020-12-12 DIAGNOSIS — O26892 Other specified pregnancy related conditions, second trimester: Secondary | ICD-10-CM | POA: Insufficient documentation

## 2020-12-12 DIAGNOSIS — Z3A18 18 weeks gestation of pregnancy: Secondary | ICD-10-CM

## 2020-12-12 DIAGNOSIS — R519 Headache, unspecified: Secondary | ICD-10-CM | POA: Diagnosis not present

## 2020-12-12 LAB — URINALYSIS, ROUTINE W REFLEX MICROSCOPIC
Bilirubin Urine: NEGATIVE
Glucose, UA: NEGATIVE mg/dL
Hgb urine dipstick: NEGATIVE
Ketones, ur: 80 mg/dL — AB
Leukocytes,Ua: NEGATIVE
Nitrite: NEGATIVE
Protein, ur: NEGATIVE mg/dL
Specific Gravity, Urine: 1.015 (ref 1.005–1.030)
pH: 6 (ref 5.0–8.0)

## 2020-12-12 MED ORDER — FAMOTIDINE 20 MG PO TABS: 20.0000 mg | ORAL_TABLET | Freq: Every day | ORAL | 0 refills | Status: DC

## 2020-12-12 MED ORDER — PROMETHAZINE HCL 25 MG PO TABS: 25.0000 mg | ORAL_TABLET | Freq: Four times a day (QID) | ORAL | 2 refills | Status: DC | PRN

## 2020-12-12 MED ORDER — FAMOTIDINE IN NACL 20-0.9 MG/50ML-% IV SOLN
20.0000 mg | Freq: Once | INTRAVENOUS | Status: AC
  Administered 2020-12-12: 20 mg via INTRAVENOUS
  Filled 2020-12-12: qty 50

## 2020-12-12 MED ORDER — LACTATED RINGERS IV BOLUS
1000.0000 mL | Freq: Once | INTRAVENOUS | Status: AC
  Administered 2020-12-12: 1000 mL via INTRAVENOUS

## 2020-12-12 MED ORDER — SODIUM CHLORIDE 0.9 % IV SOLN
25.0000 mg | Freq: Once | INTRAVENOUS | Status: AC
  Administered 2020-12-12: 25 mg via INTRAVENOUS
  Filled 2020-12-12: qty 1

## 2020-12-12 MED ORDER — ONDANSETRON HCL 4 MG/2ML IJ SOLN
4.0000 mg | Freq: Once | INTRAMUSCULAR | Status: AC
  Administered 2020-12-12: 4 mg via INTRAVENOUS
  Filled 2020-12-12: qty 2

## 2020-12-12 NOTE — MAU Note (Signed)
Was here last night.  Had ran out of zofran. Was prescribed the meds. Threw up all night. Has realized that she has 'potential mold' in her house.  Doesn't know if that is contributing.  The medication had been working, doesn't know why it isn't now. Burning in her chest, has some stomach cramps occ from throwing up. Has been having watery stools, off and on with constipation from meds, it alternates

## 2020-12-12 NOTE — MAU Note (Signed)
Called Lab to check on status of UA. Lab stated they would run the sample now.

## 2020-12-12 NOTE — MAU Note (Signed)
Upon pulling patient back to her room she states she "feels better" now and is unsure if it is because she is out of her home due to the mold in her house. She states she last took her Zofran at 0700 or 0800 this morning.

## 2020-12-12 NOTE — MAU Provider Note (Addendum)
History     CSN: [id number]  Arrival date and time: 12/12/20 9892   Event Date/Time   First Provider Initiated Contact with Patient 12/12/20 1105      Chief Complaint  Patient presents with   Emesis    Dana Mcmillan is a 25 y/o G1P0 female at 79w1dwho presents with nausea and vomiting. This has been an ongoing issue during this pregnancy and pt was seen last night in MAU for this. Zofran refilled last night and pt given scopolamine patch. After discharge, pt reports vomiting 20 times last night and 3 times this morning. Pt states she took 2 - 46mtablets of zofran between 0700-0800 this morning to no relief. Pt reports she has been unable to keep any oral intake down for the past 2 days. Pt reports she has failed trials of phenergan and diclegis in the past. Pt refused IV fluids last night but is willing today.  Pt affirms GERD, hot flashes, pre-syncope, and headaches. Pt reports headache for 4 days unrelieved with tylenol, and presyncopal episodes daily for the past week. Pt affirms stomach cramps from vomiting. Pt expresses concern of "potential mold" in her house contributing to her illness.    OB History     Gravida  1   Para      Term      Preterm      AB      Living         SAB      IAB      Ectopic      Multiple      Live Births              Past Medical History:  Diagnosis Date   Anxiety    MDD (major depressive disorder)    Panic disorder with agoraphobia and moderate panic attacks     Past Surgical History:  Procedure Laterality Date   ANKLE SURGERY Right 09/2019   WISDOM TOOTH EXTRACTION  2017    Family History  Problem Relation Age of Onset   Prostate cancer Paternal Grandfather     Social History   Tobacco Use   Smoking status: Former    Types: Cigarettes    Passive exposure: Never   Smokeless tobacco: Never  Vaping Use   Vaping Use: Every day  Substance Use Topics   Alcohol use: Not Currently    Comment: not since  confirmed pregnancy   Drug use: Not Currently    Comment: not since confirmed pregnancy    Allergies:  Allergies  Allergen Reactions   Latex    Other Rash    Medications Prior to Admission  Medication Sig Dispense Refill Last Dose   ondansetron (ZOFRAN ODT) 4 MG disintegrating tablet Take 1-2 tablets (4-8 mg total) by mouth every 6 (six) hours as needed for nausea or vomiting. 10 tablet 0 12/12/2020 at 0800   Blood Pressure KIT 1 Device by Does not apply route once a week. To be monitored from home 1 kit 0    clotrimazole (LOTRIMIN) 1 % cream Apply 1 application topically 2 (two) times daily for 14 days. 60 g 0    Doxylamine-Pyridoxine (DICLEGIS) 10-10 MG TBEC Take 2 tablets by mouth at bedtime. If symptoms persist, add one tablet in the morning and one in the afternoon (Patient not taking: Reported on 11/29/2020) 100 tablet 5    ondansetron (ZOFRAN ODT) 4 MG disintegrating tablet Take 1 tablet (4 mg total) by mouth every 8 (eight)  hours as needed for nausea or vomiting. If Promethazine does not work 20 tablet 0    ondansetron (ZOFRAN ODT) 4 MG disintegrating tablet Take 1-2 tablets (4-8 mg total) by mouth every 6 (six) hours as needed for nausea or vomiting. 30 tablet 2    Prenatal MV & Min w/FA-DHA (PRENATAL GUMMIES PO) Take by mouth.       Review of Systems  Constitutional:  Negative for chills, fever and unexpected weight change.  HENT: Negative.    Eyes: Negative.   Respiratory: Negative.    Cardiovascular: Negative.   Gastrointestinal:  Positive for abdominal pain, nausea and vomiting.  Genitourinary: Negative.  Negative for pelvic pain, vaginal bleeding and vaginal discharge.  Skin: Negative.   Neurological:  Positive for light-headedness and headaches.   Physical Exam   Blood pressure (!) 103/55, pulse 63, temperature 98.1 F (36.7 C), temperature source Oral, resp. rate 16, height _0  (1.676 m), weight 52.5 kg, last menstrual period 08/07/2020, SpO2 100 %.  Physical  Exam Constitutional:      Appearance: Normal appearance. She is normal weight.  Cardiovascular:     Rate and Rhythm: Normal rate and regular rhythm.     Pulses: Normal pulses.  Pulmonary:     Effort: Pulmonary effort is normal.  Abdominal:     General: There is no distension.     Palpations: Abdomen is soft.     Tenderness: There is no abdominal tenderness. There is no guarding.  Skin:    General: Skin is warm and dry.     Capillary Refill: Capillary refill takes less than 2 seconds.  Neurological:     General: No focal deficit present.     Mental Status: She is alert and oriented to person, place, and time.  Psychiatric:        Mood and Affect: Mood normal.        Behavior: Behavior normal.        Thought Content: Thought content normal.        Judgment: Judgment normal.    MAU Course  Procedures  MDM FHT present via doppler. Patient reports nausea alleviating since arrival. Patient is willing to proceed with IV fluids today.   Assessment and Plan  Nausea/Vomiting - IV fluids, phenergan, and zofran ordered. Pt will continue wearing scopolamine patch.  - Continue home zofran 33m ODT - Encouraged to call primary office or return to MAU if symptoms worsen or with the onset of new symptoms  2. Headache, Presyncope, Cramping - Likely secondary to N/V, dehydration, and decreased oral intake. Treatment as noted above and will continue to monitor.  - Continue tylenol PRN for headache  3. GERD - Continue protonix   IClent Ridges11/20/2022, 11:33 AM     Attestation of Supervision of Student:  I confirm that I have verified the information documented in the physician assistant student's note and that I have also personally performed the history, physical exam and all medical decision making activities.  I have verified that all services and findings are accurately documented in this student's note; and I agree with management and plan as outlined in the documentation. I  have also made any necessary editorial changes.   History JMartiniqueN Mcmillan is a 25y.o. G1P0 at 134w1dho presents with nausea & vomiting. This has been an ongoing issue with the pregnancy that as previously well controlled with zofran. She ran out of zofran on Friday & symptoms worsened. Came to MAU last night & had  new prescription for zofran but states it's not working today and she has vomited several times this morning. Also reports headache for the last 4 days. Previously took tylenol without relief. Also reports epigastric burning that is worse with vomiting. Rates pain 8/10.  Denies abdominal pain, vaginal bleeding, diarrhea, fever, or LOF.   Physical exam BP (!) 95/56 (BP Location: Left Arm)   Pulse 78   Temp 98.1 F (36.7 C) (Oral)   Resp 14   Ht _0  (1.676 m)   Wt 52.5 kg   LMP 08/07/2020   SpO2 99%   BMI 18.67 kg/m   Physical Examination: General appearance - alert, well appearing, and in no distress Mental status - normal mood, behavior, speech, dress, motor activity, and thought processes Eyes - sclera anicteric Mouth -  lips & mucous membranes moist Chest - normal respiratory effort   Assessment/Plan 1. Hyperemesis gravidarum  -weight stable -improvement of h/a & n/v after IV fluids & meds in MAU -continue zofran & will add phenergan to her regimen  2. Heartburn during pregnancy in second trimester  -rx pepcid  3. [redacted] weeks gestation of pregnancy      Jorje Guild, [place] for [employer] [employer] 12/12/2020 1:50 PM

## 2020-12-19 ENCOUNTER — Other Ambulatory Visit (INDEPENDENT_AMBULATORY_CARE_PROVIDER_SITE_OTHER): Payer: Self-pay

## 2020-12-20 ENCOUNTER — Ambulatory Visit: Payer: BLUE CROSS/BLUE SHIELD | Attending: Obstetrics and Gynecology

## 2020-12-20 ENCOUNTER — Ambulatory Visit: Payer: BLUE CROSS/BLUE SHIELD | Attending: Obstetrics and Gynecology | Admitting: Obstetrics and Gynecology

## 2020-12-20 ENCOUNTER — Other Ambulatory Visit: Payer: Self-pay

## 2020-12-20 DIAGNOSIS — O99332 Smoking (tobacco) complicating pregnancy, second trimester: Secondary | ICD-10-CM | POA: Diagnosis not present

## 2020-12-20 DIAGNOSIS — O352XX0 Maternal care for (suspected) hereditary disease in fetus, not applicable or unspecified: Secondary | ICD-10-CM

## 2020-12-20 DIAGNOSIS — Z3401 Encounter for supervision of normal first pregnancy, first trimester: Secondary | ICD-10-CM | POA: Diagnosis present

## 2020-12-20 DIAGNOSIS — Z3A19 19 weeks gestation of pregnancy: Secondary | ICD-10-CM | POA: Diagnosis not present

## 2020-12-20 DIAGNOSIS — Z8279 Family history of other congenital malformations, deformations and chromosomal abnormalities: Secondary | ICD-10-CM | POA: Diagnosis not present

## 2020-12-20 DIAGNOSIS — O289 Unspecified abnormal findings on antenatal screening of mother: Secondary | ICD-10-CM | POA: Insufficient documentation

## 2020-12-20 DIAGNOSIS — Z361 Encounter for antenatal screening for raised alphafetoprotein level: Secondary | ICD-10-CM

## 2020-12-20 DIAGNOSIS — O35HXX0 Maternal care for other (suspected) fetal abnormality and damage, fetal lower extremities anomalies, not applicable or unspecified: Secondary | ICD-10-CM | POA: Diagnosis not present

## 2020-12-20 DIAGNOSIS — O3503X0 Maternal care for (suspected) central nervous system malformation or damage in fetus, choroid plexus cysts, not applicable or unspecified: Secondary | ICD-10-CM

## 2020-12-20 DIAGNOSIS — Z363 Encounter for antenatal screening for malformations: Secondary | ICD-10-CM | POA: Diagnosis not present

## 2020-12-20 DIAGNOSIS — O280 Abnormal hematological finding on antenatal screening of mother: Secondary | ICD-10-CM

## 2020-12-20 DIAGNOSIS — Z3689 Encounter for other specified antenatal screening: Secondary | ICD-10-CM

## 2020-12-20 DIAGNOSIS — O358XX0 Maternal care for other (suspected) fetal abnormality and damage, not applicable or unspecified: Secondary | ICD-10-CM | POA: Insufficient documentation

## 2020-12-20 NOTE — Progress Notes (Signed)
Maternal-Fetal Medicine   Name: Dana Mcmillan DOB: 10-09-1995 MRN: [id number] Referring Provider: Jaynie Collins, MD  I had the pleasure of seeing Ms. Schewe [facility] at [facility]. She G1 P0 at 19w 2d gestation and is here for fetal anatomy scan.  On serum screening, maternal serum alpha fetoprotein (MSAFP) is increased at 6.13 MoM.  The risk for open spina bifida is 1 in 10. On cell free fetal DNA screening, the risks of fetal aneuploidies are not increased.  Her pregnancy is dated by sure LMP consistent with 7-week ultrasound. Past medical history: No history of hypertension or diabetes or any chronic medical conditions. Past surgical history: Tonsillectomy, right ankle surgery (fracture). Medications: Zofran, famotidine, Phenergan, prenatal vitamins. Allergies: No known drug allergies. Social history: Patient admits to vaping.  No alcohol or drug use.  Her partner is Caucasian and he is in good health. Family history: No history of venous thromboembolism in the family.  Both parents are in good health.  Ultrasound We performed a fetal anatomical survey.  Amniotic fluid is normal and good fetal activity seen.  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  Left foot appears normal.  Intracranial structures, fetal spine and abdominal wall cord insertion appear normal.  Bilateral choroid plexus cysts are seen.  No other markers or obvious structural defects are seen.  Our concerns include: Club foot -Prevalence is about 1 in 1000. It is more common in males. -Isolated club feet (absence of other anomalies) carry good prognosis and are usually not associated with chromosomal anomalies or genetic syndromes. However, ultrasound has limitations in detecting fetal anomalies. -About 25% of all fetuses with club feet (usually with additional anomalies) can have some genetic syndromes. -I discussed amniocentesis that will detect some and not all genetic syndromes. I explained the procedure and  possible complication of miscarriage (1 in 500 procedures). -I counseled the patient that the likelihood of chromosomal anomaly or genetic syndrome is very low in the absence of other anomalies. She understands that only amniocentesis will give a definitive result on the fetal karyotype and can detect some genetic conditions. -Smoking is a causative factor and the patient vapes.  -Ultrasound finding of club feet does not correlate well with postnatal findings that will influence surgical approaches. Surgical treatment will be necessary in about 50% of cases and I will defer that counseling to orthopedics team.  - I recommended genetic counseling and fetal echocardiography. -False positive finding (positional) of club feet occurs in about 15% of cases and follow-up scans are necessary.  Increased risk for open-neural tube defects and increased AFP I reassured the patient of normal fetal anatomy on ultrasound including spine and intracranial structures. I informed her that ultrasound can detect up to 95 out of 100 cases of spina bifida and that amniocentesis will only marginally improve the detection rate.  Increased AFP can also be associated with fetal growth restriction (placental insufficiency), preterm delivery and stillbirth. I recommended serial fetal growth assessments. It can also follow vaginal bleeding. Patient does not give history of vaginal bleeding.  Choroid plexus cysts I reassured the patient choroid plexus cysts are considered normal variants given that she had low risk for trisomy 18 on cell free fetal DNA screening.  The cysts usually resolve at 28 to [redacted] weeks gestation.  After counseling, the patient agreed to meet with our genetic counselor and will decide on amniocentesis.  Recommendations -Genetic counseling appointment was made for 12/23/20. -We have requested an appointment for fetal echocardiography ([facility]). -Follow-up ultrasound  in 4 weeks for fetal growth  assessment. -Fetal growth assessment every 4 weeks till delivery. -We will set up appointment with orthopedic specialist in the third trimester after consultation genetic counselor.  Thank you for consultation.  If you have any questions or concerns, please contact me the [facility].  Consultation including face-to-face (more than 50%) counseling 45 minutes.

## 2020-12-21 ENCOUNTER — Other Ambulatory Visit: Payer: Self-pay | Admitting: *Deleted

## 2020-12-21 ENCOUNTER — Encounter: Payer: Self-pay | Admitting: Obstetrics & Gynecology

## 2020-12-21 DIAGNOSIS — Z362 Encounter for other antenatal screening follow-up: Secondary | ICD-10-CM

## 2020-12-21 DIAGNOSIS — Q6689 Other  specified congenital deformities of feet: Secondary | ICD-10-CM

## 2020-12-21 DIAGNOSIS — O3503X0 Maternal care for (suspected) central nervous system malformation or damage in fetus, choroid plexus cysts, not applicable or unspecified: Secondary | ICD-10-CM | POA: Insufficient documentation

## 2020-12-23 ENCOUNTER — Other Ambulatory Visit: Payer: Self-pay

## 2020-12-23 ENCOUNTER — Ambulatory Visit: Payer: BLUE CROSS/BLUE SHIELD | Attending: Obstetrics and Gynecology

## 2020-12-23 DIAGNOSIS — Z8269 Family history of other diseases of the musculoskeletal system and connective tissue: Secondary | ICD-10-CM

## 2020-12-23 DIAGNOSIS — O283 Abnormal ultrasonic finding on antenatal screening of mother: Secondary | ICD-10-CM

## 2020-12-23 DIAGNOSIS — O280 Abnormal hematological finding on antenatal screening of mother: Secondary | ICD-10-CM | POA: Diagnosis not present

## 2020-12-23 NOTE — Progress Notes (Addendum)
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   EDC: 05/14/2021 LMP: 08/07/2020 Referring Provider:  Jaynie Collins, MD  EGA: [redacted]w[redacted]d Genetic Counselor: Teena Dunk, MS, CGC  OB Hx: G1P0 Date of Appointment: 12/23/2020  Accompanied by: Father of the current pregnancy Viviann Spare) and Steven's mother Face to Face Time: 40 Minutes   Previous Testing Completed: CBC from 11/09/2020 reviewed. MCV within normal limits. It is unlikely that Dana is a beta thalassemia carrier or an alpha thalassemia carrier of the double-gene deletion. Individuals with a normal MCV may be single-gene deletion carriers, but it is unlikely that the current pregnancy would be affected with alpha or beta thalassemia major. Dana previously completed Non-Invasive Prenatal Screening (NIPS) in this pregnancy (scanned into Epic under the Media tab). The result is low risk, consistent with a female fetus. This screening significantly reduces the risk that the current pregnancy has Down syndrome, Trisomy 34, Trisomy 13, Monosomy X, and Triploidy, however, the risk is not zero given the limitations of NIPS. Additionally, there are many genetic conditions that cannot be detected by NIPS.    Medical History:  Reports she takes prenatal vitmains, ondansetron 4mg , famotidine 20mg , and promethazine 25mg . Patient reports she vapes daily.  Denies personal history of diabetes, high blood pressure, thyroid conditions, and seizures. Denies bleeding, infections, and fevers in this pregnancy. Denies using alcohol and street drugs in this pregnancy.   Family History: A pedigree was created and scanned into Epic under the Media tab. reports he has 3 sons from a previous partner. His youngest son had unilateral clubfoot with no other birth defects. reports his father had bilateral clubfeet and no other birth defects.  reports her  maternal aunt may have had a clubfoot but she is not sure. Steven's mother reports four of her siblings with Charcot Marie Tooth disease (type unknown). Medical records and genetic testing results for these four individuals are not available for review.  Maternal ethnicity reported as Caucasian (mix of Viviann Spare, Viviann Spare, and Dana) and paternal ethnicity reported as Caucasian (mix of [place], [place], and possibly [place]). Denies Ashkenazi Jewish ancestry. Family history not remarkable for consanguinity, intellectual disability, autism spectrum disorder, multiple spontaneous abortions, still births, or unexplained neonatal death.     Genetic Counseling:   Clubfoot visualized on ultrasound. Clubfoot is characterized by abnormal bone formation in the foot. It occurs with a frequency of 1 to 3 in 1,000 live births, making it the most common musculoskeletal abnormality. It can occur as an isolated abnormality or as part of more than 200 chromosomal, genetic, or sporadic multiple-malformation syndromes. The incidence of associated abnormalities has been reported to be between 24-50%. Clubfoot is frequently secondary to lower extremity paralysis from a neural tube defect. There are also extrinsic causes of clubfoot, such as oligohydramnios, uterine cavity abnormality (leiomyomas), and amniotic bands. Given the finding of clubfoot on ultrasound and Steven's known family history of clubfoot, genetic counseling reviewed with the couple that genetic testing via amniocentesis may be helpful to determine the etiology of the clubfoot. [place] accepted amniocentesis for prenatal diagnosis and was scheduled for the procedure on 12/28/20. Testing that will be ordered on the amniotic fluid sample includes a fetal karyotype with reflex to microarray analysis.   MSAFP high risk for Open Neural Tube Defect (ONTD) risk is 1 in 10 after the screen. An elevated maternal serum AFP could indicate that the current pregnancy  has an ONTD which is a birth defect involving the incomplete development of the brain, spinal cord, or their protective coverings. Approximately 2-5% of ONTDs will be syndromic due to conditions such as chromosome abnormalities. When not associated with a particular condition, ONTDs are thought to have a multifactorial etiology. Nongenetic risk factors include reduced folate intake, maternal anticonvulsant medication, maternal diabetes mellitus, obesity, and hyperthermia. Tanish's anatomy ultrasound on 12/20/2020 did not detect an ONTD. Prenatal detection of ONTDs is largely influenced by the gestational age and type of neural tube defect. A second trimester ultrasound identifies virtually 100% of anencephaly cases. A second trimester ultrasound also routinely evaluates for spina bifida, and examination of the entire length of the spine in the sagittal, axial, and coronal planes in combination with a cranial evaluation identifies many cases: the detection rate is approximately 90-98%. We reviewed with Dana that we can add AF-AFP studies with reflex to acetylcholinesterase testing to the amniocentesis she will have completed on 12/28/2020.  Family History of Charcot Hilda Lias Tooth Disease Type Unknown. Steven's mother reports four of her siblings with Charcot Marie Tooth disease. No medical records or genetic testing results are available for these individuals. Genetic counseling briefly reviewed that there are many types of Charcot Marie Tooth Disease with various inheritance patterns and symptoms. It is difficult to quote risk to the current pregnancy given the limited amount of information that is available. Steven's mother is encouraged to obtain more information about her affected relatives including the type of Charcot Hilda Lias Tooth Disease they have. Risk to the current pregnancy can be more accurately assessed with this information.   Testing/Screening Options:   Amniocentesis. This procedure is available  for prenatal diagnosis. Possible procedural difficulties and complications that can arise include maternal infection, cramping, bleeding, fluid leakage, and/or pregnancy loss. The risk for pregnancy loss with an amniocentesis is 1/500. Per the [employer] (ACOG) Practice Bulletin 162, all pregnant women should be offered prenatal assessment for aneuploidy by diagnostic testing regardless of maternal age or other risk factors. If indicated, genetic testing that could be ordered on an amniocentesis sample includes a fetal karyotype, fetal microarray, and testing for specific syndromes.  Carrier screening. Per the ACOG Committee Opinion 691, all women who are considering a pregnancy or are currently pregnant should be offered carrier screening for, at minimum, Cystic Fibrosis (CF), Spinal Muscular Atrophy (SMA), and Hemoglobinopathies. The mode of inheritance, clinical manifestations of these conditions, as well as details about testing were reviewed. A negative result on carrier screening reduces the likelihood of being a carrier, however, does not entirely rule out the possibility. If Dana was found to be a carrier for a specific condition, carrier screening for their reproductive partner would be recommended.    Patient Plan:  Proceed with: Amniocentesis for prenatal diagnosis, carrier screening for CF, SMA, evaluation of MCV, and hemoglobin electrophoresis.   Informed consent was obtained. All questions were answered.   Thank you for sharing in the care of Dana with [place].  Please do not hesitate to contact us if you have any questions.  Teena Dunk, MS, [facility]

## 2020-12-24 ENCOUNTER — Other Ambulatory Visit: Payer: Self-pay | Admitting: Obstetrics and Gynecology

## 2020-12-24 DIAGNOSIS — Z3A19 19 weeks gestation of pregnancy: Secondary | ICD-10-CM

## 2020-12-24 DIAGNOSIS — Z361 Encounter for antenatal screening for raised alphafetoprotein level: Secondary | ICD-10-CM

## 2020-12-24 DIAGNOSIS — O3503X0 Maternal care for (suspected) central nervous system malformation or damage in fetus, choroid plexus cysts, not applicable or unspecified: Secondary | ICD-10-CM

## 2020-12-24 DIAGNOSIS — O35HXX0 Maternal care for other (suspected) fetal abnormality and damage, fetal lower extremities anomalies, not applicable or unspecified: Secondary | ICD-10-CM

## 2020-12-27 ENCOUNTER — Other Ambulatory Visit: Payer: Self-pay

## 2020-12-27 ENCOUNTER — Encounter: Payer: Self-pay | Admitting: Obstetrics & Gynecology

## 2020-12-27 ENCOUNTER — Ambulatory Visit (INDEPENDENT_AMBULATORY_CARE_PROVIDER_SITE_OTHER): Payer: BLUE CROSS/BLUE SHIELD | Admitting: Obstetrics & Gynecology

## 2020-12-27 VITALS — BP 118/79 | HR 97 | Wt 123.0 lb

## 2020-12-27 DIAGNOSIS — Z3A20 20 weeks gestation of pregnancy: Secondary | ICD-10-CM

## 2020-12-27 DIAGNOSIS — O219 Vomiting of pregnancy, unspecified: Secondary | ICD-10-CM

## 2020-12-27 DIAGNOSIS — Z3402 Encounter for supervision of normal first pregnancy, second trimester: Secondary | ICD-10-CM

## 2020-12-27 DIAGNOSIS — O280 Abnormal hematological finding on antenatal screening of mother: Secondary | ICD-10-CM

## 2020-12-27 DIAGNOSIS — O3503X0 Maternal care for (suspected) central nervous system malformation or damage in fetus, choroid plexus cysts, not applicable or unspecified: Secondary | ICD-10-CM

## 2020-12-27 MED ORDER — ONDANSETRON 4 MG PO TBDP: 4.0000 mg | ORAL_TABLET | Freq: Four times a day (QID) | ORAL | 3 refills | Status: DC | PRN

## 2020-12-27 NOTE — Progress Notes (Signed)
PRENATAL VISIT NOTE  Subjective:  Dana Mcmillan is a 25 y.o. G1P0 at [redacted]w[redacted]d being seen today for ongoing prenatal care.  She is currently monitored for the following issues for this low-risk pregnancy and has Encounter for supervision of normal first pregnancy in second trimester; Family history of clubfoot in FOB; Asymptomatic bacteriuria during pregnancy in first trimester; Abnormal antenatal AFP screen; and Choroid plexus cyst of fetus, bilateral, in singleton pregnancy on their problem list.  Patient reports no complaints.  Contractions: Not present. Vag. Bleeding: None.  Movement: Absent. Denies leaking of fluid.   The following portions of the patient's history were reviewed and updated as appropriate: allergies, current medications, past family history, past medical history, past social history, past surgical history and problem list.   Objective:   Vitals:   12/27/20 1515  BP: 118/79  Pulse: 97  Weight: 123 lb (55.8 kg)    Fetal Status: Fetal Heart Rate (bpm): 148   Movement: Absent     General:  Alert, oriented and cooperative. Patient is in no acute distress.  Skin: Skin is warm and dry. No rash noted.   Cardiovascular: Normal heart rate noted  Respiratory: Normal respiratory effort, no problems with respiration noted  Abdomen: Soft, gravid, appropriate for gestational age.  Pain/Pressure: Absent     Pelvic: Cervical exam deferred        Extremities: Normal range of motion.  Edema: None  Mental Status: Normal mood and affect. Normal behavior. Normal judgment and thought content.   Imaging: [place] MFM OB DETAIL +14 WK  Result Date: 12/20/2020 ----------------------------------------------------------------------  OBSTETRICS REPORT                       (Signed Final 12/20/2020 05:24 pm) ---------------------------------------------------------------------- Patient Info  ID #:       [id number]                          D.O.B.:  [birth year] (25 yrs)  Name:       Dana N Wedig                  Visit Date: 12/20/2020 02:18 pm ---------------------------------------------------------------------- Performed By  Attending:        Noralee Space MD        Ref. Address:     [address]                                                             [place], [place]                                                             [address]  Performed By:  Redgie Grayer      [facility]  Referred By:      Adam Phenix                    MD ---------------------------------------------------------------------- Orders  #  Description                           Code        Ordered By  1  [place] MFM OB DETAIL +14 WK               76811.01    Jaynie Collins ----------------------------------------------------------------------  #  Order #                     Accession #                Episode #  1  [id number]                   [id number]                 [id number] ---------------------------------------------------------------------- Indications  Antenatal screening for elevated               Z36.1  alphafetoprotein level (AFP 6.13 MoM)  Abnormal fetal ultrasound (right club foot)    O28.9  Fetal choroid plexus cyst (bilateral)          O35.8XX0  Tobacco use complicating pregnancy,            O99.332  second trimester (vaping)  Family history of congenital anomaly (FOB      Z82.79  had clubfoot)  [redacted] weeks gestation of pregnancy                Z3A.19  Antenatal screening for malformations          Z36.3  LR NIPS ---------------------------------------------------------------------- Fetal Evaluation  Num Of Fetuses:         1  Fetal Heart Rate(bpm):  145  Cardiac Activity:       Observed  Presentation:            Cephalic  Placenta:               Posterior  Amniotic Fluid  AFI FV:      Within normal  limits                              Largest Pocket(cm)                              4.2 ---------------------------------------------------------------------- Biometry  BPD:      43.3  mm     G. Age:  19w 1d         49  %    CI:        74.12   %    70 - 86                                                          FL/HC:      15.5   %    16.1 - 18.3  HC:      159.7  mm     G. Age:  18w 6d         27  %    HC/AC:      1.21        1.09 - 1.39  AC:      132.5  mm     G. Age:  18w 5d         32  %    FL/BPD:     57.3   %  FL:       24.8  mm     G. Age:  17w 3d        3.6  %    FL/AC:      18.7   %    20 - 24  HUM:      24.4  mm     G. Age:  17w 4d          6  %  CER:      19.3  mm     G. Age:  18w 6d         28  %  NFT:       3.8  mm  LV:        6.1  mm  CM:        5.4  mm  Est. FW:     231  gm      0 lb 8 oz      8  % ---------------------------------------------------------------------- OB History  Gravidity:    1         Term:   0 ---------------------------------------------------------------------- Gestational Age  LMP:           19w 1d        Date:  08/08/20                 EDD:   05/15/21  U/S Today:     18w 4d                                        EDD:   05/19/21  Best:          19w 1d     Det. By:  LMP  (08/08/20)  EDD:   05/15/21 ---------------------------------------------------------------------- Anatomy  Cranium:               Appears normal         LVOT:                   Not well visualized  Cavum:                 Appears normal         Aortic Arch:            Appears normal  Ventricles:            Appears normal         Ductal Arch:            Not well visualized  Choroid Plexus:        Bilateral choroid      Diaphragm:              Appears normal                         plexus cysts  Cerebellum:            Appears normal         Stomach:                Appears normal, left                                                                         sided  Posterior Fossa:       Appears normal         Abdomen:                Appears normal  Nuchal Fold:           Appears normal         Abdominal Wall:         Appears nml (cord                                                                        insert, abd wall)  Face:                  Appears normal         Cord Vessels:           Appears normal (3                         (orbits and profile)                           vessel cord)  Lips:                  Appears normal         Kidneys:  Appear normal  Palate:                Not well visualized    Bladder:                Appears normal  Thoracic:              Appears normal         Spine:                  Appears normal  Heart:                 Not well visualized    Upper Extremities:      Appears normal  RVOT:                  Not well visualized    Lower Extremities:      Right Club foot  Other:  Fetus appears to be a female. Nasal bone, lenses, open hands/5th          digits, VC, and 3VV visualized. ---------------------------------------------------------------------- Cervix Uterus Adnexa  Cervix  Length:           3.67  cm.  Normal appearance by transabdominal scan.  Uterus  No abnormality visualized.  Right Ovary  Within normal limits.  Left Ovary  Within normal limits.  Adnexa  No adnexal mass visualized. ---------------------------------------------------------------------- Impression  We performed a fetal anatomical survey.  Amniotic fluid is  normal and good fetal activity seen.  Fetal biometry is  consistent with the previously established dates.  Right  clubfoot is seen.  Left foot appears normal.  Intracranial  structures, fetal spine and abdominal wall cord insertion  appear normal.  Bilateral choroid plexus cysts are seen.  No  other markers or obvious structural defects are seen.  xxxxxxxxxxxxxxxxxxxxxxxxxxxxxxxxxxxxxxxxxxxxx  Consultation (see EPIC )  I had the pleasure of seeing Ms. Slingerland today  [facility]. She G1 P0 at 19w 2d gestation and  is here for fetal anatomy scan.  On serum screening, maternal serum alpha fetoprotein  (MSAFP) is increased at 6.13 MoM.  The risk for open spina  bifida is 1 in 10. On cell free fetal DNA screening, the risks of  fetal aneuploidies are not increased.  Her pregnancy is dated by sure LMP consistent with 7-week  ultrasound.  Past medical history: No history of hypertension or diabetes  or any chronic medical conditions.  Past surgical history: Tonsillectomy, right ankle surgery  (fracture).  Medications: Zofran, famotidine, Phenergan, prenatal  vitamins.  Allergies: No known drug allergies.  Social history: Patient admits to vaping.  No alcohol or drug  use.  Her partner is Caucasian and he is in good health.  Family history: No history of venous thromboembolism in the  family.  Both parents are in good health.  Our concerns include:  Club foot  -Prevalence is about 1 in 1000. It is more common in males.  -Isolated club feet (absence of other anomalies) carry good  prognosis and are usually not associated with chromosomal  anomalies or genetic syndromes. However, ultrasound has  limitations in detecting fetal anomalies.  -About 25% of all fetuses with club feet (usually with  additional anomalies) can have some genetic syndromes.  -I discussed amniocentesis that will detect some and not all  genetic syndromes. I explained the procedure and possible  complication of miscarriage (1 in 500 procedures).  -I counseled the patient that the likelihood  of chromosomal  anomaly or genetic syndrome is very low in the absence of  other anomalies. She understands that only amniocentesis  will give a definitive result on the fetal karyotype and can  detect some genetic conditions.  -Smoking is a causative factor and the patient vapes.  -Ultrasound finding of club feet does not correlate well with  postnatal findings that will influence surgical approaches.   Surgical treatment will be necessary in about 50% of cases  and I will defer that counseling to orthopedics team.  - I recommended genetic counseling and fetal  echocardiography.  -False positive finding (positional) of club feet occurs in about  15% of cases and follow-up scans are necessary.  Increased risk for open-neural tube defects and increased  AFP  I reassured the patient of normal fetal anatomy on ultrasound  including spine and intracranial structures. I informed her that  ultrasound can detect up to 95 out of 100 cases of spina  bifida and that amniocentesis will only marginally improve the  detection rate.  Increased AFP can also be associated with fetal growth  restriction (placental insufficiency), preterm delivery and  stillbirth. I recommended serial fetal growth assessments. It  can also follow vaginal bleeding. Patient does not give history  of vaginal bleeding.  Choroid plexus cysts  I reassured the patient choroid plexus cysts are considered  normal variants given that she had low risk for trisomy 18 on  cell free fetal DNA screening.  The cysts usually resolve at 28  to [redacted] weeks gestation.  After counseling, the patient agreed to meet with our genetic  counselor and will decide on amniocentesis. ---------------------------------------------------------------------- Recommendations  -Genetic counseling appointment was made for 12/23/20.  -We have requested an appointment for fetal  echocardiography ([facility]).  -Follow-up ultrasound in 4 weeks for fetal growth assessment.  -Fetal growth assessment every 4 weeks till delivery.  -We will set up appointment with orthopedic specialist in the  third trimester after consultation genetic counselor. ----------------------------------------------------------------------                  Noralee Space, MD Electronically Signed Final Report   12/20/2020 05:24 pm ----------------------------------------------------------------------   Assessment and Plan:   Pregnancy: G1P0 at [redacted]w[redacted]d 1. Abnormal antenatal AFP screen 2. Choroid plexus cyst of fetus, bilateral, in singleton pregnancy Already scheduled for amniocentesis on 12/28/20, will follow up results and manage accordingly. Follow up MFM scans as recommended.  3. Nausea and vomiting during pregnancy Zofran refilled for patient as requested. - ondansetron (ZOFRAN ODT) 4 MG disintegrating tablet; Take 1-2 tablets (4-8 mg total) by mouth every 6 (six) hours as needed for nausea or vomiting.  Dispense: 20 tablet; Refill: 3  4. [redacted] weeks gestation of pregnancy 5. Encounter for supervision of normal first pregnancy in second trimester Preterm labor symptoms and general obstetric precautions including but not limited to vaginal bleeding, contractions, leaking of fluid and fetal movement were reviewed in detail with the patient. Please refer to After Visit Summary for other counseling recommendations.   Return in about 4 weeks (around 01/24/2021) for OFFICE OB VISIT (MD or APP), can be virtual.  Future Appointments  Date Time Provider Department Center  12/28/2020 12:45 PM [facility] NURSE [facility]  12/28/2020 12:45 PM [facility] US7 [facility]  12/28/2020  2:00 PM [facility] LAB [facility]  01/18/2021  3:30 PM [facility] NURSE [facility]  01/18/2021  3:45 PM [facility] US4 [facility]    Jaynie Collins, MD

## 2020-12-28 ENCOUNTER — Other Ambulatory Visit: Payer: Self-pay

## 2020-12-28 ENCOUNTER — Encounter: Payer: Self-pay | Admitting: *Deleted

## 2020-12-28 ENCOUNTER — Ambulatory Visit (HOSPITAL_BASED_OUTPATIENT_CLINIC_OR_DEPARTMENT_OTHER): Payer: BLUE CROSS/BLUE SHIELD

## 2020-12-28 ENCOUNTER — Ambulatory Visit: Payer: BLUE CROSS/BLUE SHIELD

## 2020-12-28 ENCOUNTER — Other Ambulatory Visit: Payer: Self-pay | Admitting: Obstetrics and Gynecology

## 2020-12-28 ENCOUNTER — Ambulatory Visit: Payer: BLUE CROSS/BLUE SHIELD | Attending: Obstetrics and Gynecology | Admitting: *Deleted

## 2020-12-28 VITALS — BP 110/76 | HR 95

## 2020-12-28 DIAGNOSIS — O3503X0 Maternal care for (suspected) central nervous system malformation or damage in fetus, choroid plexus cysts, not applicable or unspecified: Secondary | ICD-10-CM

## 2020-12-28 DIAGNOSIS — Z8279 Family history of other congenital malformations, deformations and chromosomal abnormalities: Secondary | ICD-10-CM | POA: Diagnosis not present

## 2020-12-28 DIAGNOSIS — Z3A19 19 weeks gestation of pregnancy: Secondary | ICD-10-CM

## 2020-12-28 DIAGNOSIS — F17200 Nicotine dependence, unspecified, uncomplicated: Secondary | ICD-10-CM | POA: Insufficient documentation

## 2020-12-28 DIAGNOSIS — O283 Abnormal ultrasonic finding on antenatal screening of mother: Secondary | ICD-10-CM | POA: Insufficient documentation

## 2020-12-28 DIAGNOSIS — R109 Unspecified abdominal pain: Secondary | ICD-10-CM | POA: Diagnosis present

## 2020-12-28 DIAGNOSIS — O35HXX0 Maternal care for other (suspected) fetal abnormality and damage, fetal lower extremities anomalies, not applicable or unspecified: Secondary | ICD-10-CM | POA: Insufficient documentation

## 2020-12-28 DIAGNOSIS — O99332 Smoking (tobacco) complicating pregnancy, second trimester: Secondary | ICD-10-CM | POA: Diagnosis not present

## 2020-12-28 DIAGNOSIS — Z361 Encounter for antenatal screening for raised alphafetoprotein level: Secondary | ICD-10-CM | POA: Insufficient documentation

## 2020-12-28 DIAGNOSIS — Z31430 Encounter of female for testing for genetic disease carrier status for procreative management: Secondary | ICD-10-CM

## 2020-12-28 DIAGNOSIS — Z3402 Encounter for supervision of normal first pregnancy, second trimester: Secondary | ICD-10-CM

## 2020-12-28 DIAGNOSIS — Z3A20 20 weeks gestation of pregnancy: Secondary | ICD-10-CM | POA: Insufficient documentation

## 2020-12-28 NOTE — Addendum Note (Signed)
Addended by: Teena Dunk on: 12/28/2020 01:03 PM   Modules accepted: Orders

## 2020-12-28 NOTE — Progress Notes (Signed)
Pt described so much lower abd cramping last night that she couldn't stand to cook. She layed down and they subsided a bit but she still reports ongoing mild cramps today.

## 2020-12-30 ENCOUNTER — Telehealth: Payer: Self-pay | Admitting: Genetics

## 2020-12-30 NOTE — Telephone Encounter (Signed)
Called [place] to return hemoglobin electrophoresis and AF-AFP results. Left voicemail with [facility] call back number.

## 2021-01-03 ENCOUNTER — Ambulatory Visit: Payer: BLUE CROSS/BLUE SHIELD

## 2021-01-04 ENCOUNTER — Telehealth: Payer: Self-pay

## 2021-01-04 ENCOUNTER — Ambulatory Visit: Payer: Self-pay

## 2021-01-04 NOTE — Telephone Encounter (Signed)
TC from pt stating Rx refilled is locked on Mychart.  Pt wants refill on zofran Rx recently sent on 12/27/20 pt has 3 refills  Pt advised to contact pharmacy and if needs further assistance to reach back out to our office.  Pt advised may  have to wait a few days due to Ins to refill since Rx was refilled last week.  Pt voiced understanding.

## 2021-01-07 ENCOUNTER — Telehealth: Payer: Self-pay | Admitting: Genetics

## 2021-01-07 NOTE — Telephone Encounter (Signed)
Called [place] to review results. 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 Given Desere's carrier status we recommended screening for her partner as soon as possible. Her partner is going to come in [facility] the [facility] on Monday 12/19 to have his blood drawn.  Hemoglobin electrophoresis was normal. No abnormal hemoglobin bands noted.  AF-AFP value was not elevated for the gestational age.  Karyotype with reflex to microarray are pending. All questions answered.

## 2021-01-12 ENCOUNTER — Telehealth: Payer: Self-pay | Admitting: Genetics

## 2021-01-12 NOTE — Telephone Encounter (Signed)
Returned normal amniotic fluid karyotype result to [place]. The karyotype is 46,XY. Reviewed that the fetal microarray and Steven's CF carrier screening are still pending. All questions answered.

## 2021-01-14 ENCOUNTER — Telehealth: Payer: Self-pay | Admitting: Genetics

## 2021-01-14 NOTE — Telephone Encounter (Signed)
Called [place] to return Steven's negative Cystic Fibrosis carrier screening results. Left voicemail with [facility] call back number.

## 2021-01-14 NOTE — Telephone Encounter (Signed)
Dana Mcmillan returned genetic counseling's phone call. We reviewed that Dana Mcmillan's CF carrier screen is negative. A negative result on carrier screening reduces the likelihood of being a carrier, however, does not entirely rule out the possibility. All questions answered.

## 2021-01-18 ENCOUNTER — Encounter: Payer: Self-pay | Admitting: *Deleted

## 2021-01-18 ENCOUNTER — Ambulatory Visit: Payer: BLUE CROSS/BLUE SHIELD | Admitting: *Deleted

## 2021-01-18 ENCOUNTER — Ambulatory Visit: Payer: BLUE CROSS/BLUE SHIELD | Attending: Obstetrics and Gynecology

## 2021-01-18 ENCOUNTER — Other Ambulatory Visit: Payer: Self-pay

## 2021-01-18 VITALS — BP 121/81 | HR 72

## 2021-01-18 DIAGNOSIS — O289 Unspecified abnormal findings on antenatal screening of mother: Secondary | ICD-10-CM | POA: Insufficient documentation

## 2021-01-18 DIAGNOSIS — Z362 Encounter for other antenatal screening follow-up: Secondary | ICD-10-CM | POA: Diagnosis present

## 2021-01-18 DIAGNOSIS — Q6689 Other  specified congenital deformities of feet: Secondary | ICD-10-CM | POA: Diagnosis present

## 2021-01-18 DIAGNOSIS — Z3402 Encounter for supervision of normal first pregnancy, second trimester: Secondary | ICD-10-CM | POA: Diagnosis present

## 2021-01-18 DIAGNOSIS — Z3A23 23 weeks gestation of pregnancy: Secondary | ICD-10-CM | POA: Diagnosis not present

## 2021-01-18 DIAGNOSIS — O3503X0 Maternal care for (suspected) central nervous system malformation or damage in fetus, choroid plexus cysts, not applicable or unspecified: Secondary | ICD-10-CM | POA: Diagnosis present

## 2021-01-18 DIAGNOSIS — Z361 Encounter for antenatal screening for raised alphafetoprotein level: Secondary | ICD-10-CM | POA: Insufficient documentation

## 2021-01-18 DIAGNOSIS — Z8279 Family history of other congenital malformations, deformations and chromosomal abnormalities: Secondary | ICD-10-CM | POA: Diagnosis not present

## 2021-01-18 DIAGNOSIS — O99332 Smoking (tobacco) complicating pregnancy, second trimester: Secondary | ICD-10-CM | POA: Insufficient documentation

## 2021-01-19 ENCOUNTER — Other Ambulatory Visit: Payer: Self-pay | Admitting: *Deleted

## 2021-01-19 DIAGNOSIS — R772 Abnormality of alphafetoprotein: Secondary | ICD-10-CM

## 2021-01-19 DIAGNOSIS — O35HXX0 Maternal care for other (suspected) fetal abnormality and damage, fetal lower extremities anomalies, not applicable or unspecified: Secondary | ICD-10-CM

## 2021-01-19 DIAGNOSIS — O99332 Smoking (tobacco) complicating pregnancy, second trimester: Secondary | ICD-10-CM

## 2021-01-23 NOTE — L&D Delivery Note (Addendum)
OB/GYN Faculty Practice Delivery Note ? ?Dana Mcmillan is a 26 y.o. G1 now P1 s/p SVD at [redacted]w[redacted]d. She was admitted for IOL for elevated AFP.  ? ?ROM: 8h 5m with clear fluid ?GBS Status: GBS negative ?Maximum Maternal Temperature: 98.3 ? ?Labor Progress: ?Presented for IOL. Initial cervical check was 3cm so Pitocin was started. She was AROMed and later had variable decelerations so Pitocin was stopped and IUPC was placed. She ultimately progressed to complete.   ? ?Delivery Date/Time: 04/30/2021 04:15 ?Delivery: Called to room and patient was complete and pushing. Head delivered ROA. Loose nuchal cord present x1, reduced. Shoulder and body delivered in usual fashion. Infant with spontaneous cry, placed on mother's abdomen, dried and stimulated. Cord clamped x 2 after 1-minute delay, and cut by FOB. Cord blood drawn. Placenta delivered spontaneously with gentle cord traction. Fundus firm with massage and Pitocin. Labia, perineum, vagina, and cervix inspected inspected with bilateral periurethral lacerations which were repaired with 4-0 vicryl with interrupted sutures.  ? ?Placenta: intact, 3-vessel cord, sent to L&D ?Complications: None ?Lacerations: Bilateral Periurethral lacerations, repaired with 4-0 vicryl  ?EBL: 50 cc ?Analgesia: Epidural ? ? ? ?Infant: Baby Boy  APGARs 9, 9  weight pending ? ?Theresia Majors, MD ?FM PGY-1 ?04/30/2021, 4:44 AM ? ? ? ?GME ATTESTATION:  ?I saw and evaluated the patient. I agree with the findings and the plan of care as documented in the resident?s note and have made all necessary edits. I was gowned and gloved for entire delivery and repair. ? ?Warner Mccreedy, MD, MPH ?OB Fellow, Faculty Practice ?[place], [facility] ?04/30/2021 5:04 AM ? ? ?

## 2021-01-24 ENCOUNTER — Other Ambulatory Visit: Payer: Self-pay | Admitting: Obstetrics & Gynecology

## 2021-01-24 DIAGNOSIS — O219 Vomiting of pregnancy, unspecified: Secondary | ICD-10-CM

## 2021-01-24 LAB — MCC TRACKING

## 2021-01-25 ENCOUNTER — Telehealth: Payer: BLUE CROSS/BLUE SHIELD | Admitting: Obstetrics and Gynecology

## 2021-01-25 ENCOUNTER — Encounter: Payer: Self-pay | Admitting: Obstetrics and Gynecology

## 2021-01-25 ENCOUNTER — Other Ambulatory Visit: Payer: Self-pay

## 2021-01-25 DIAGNOSIS — O35HXX0 Maternal care for other (suspected) fetal abnormality and damage, fetal lower extremities anomalies, not applicable or unspecified: Secondary | ICD-10-CM | POA: Insufficient documentation

## 2021-01-25 DIAGNOSIS — Z141 Cystic fibrosis carrier: Secondary | ICD-10-CM | POA: Insufficient documentation

## 2021-01-25 NOTE — Progress Notes (Signed)
Virtual ROB [redacted]w[redacted]d  Pt not able to check B/P at this time.    CC: None

## 2021-01-26 NOTE — Progress Notes (Signed)
OB Note  Patient is out of town and with bad reception on phone.I sent link to video visit but no response. I called her number again and it went straight to VM x 2. Office to reschedule  Cornelia Copa MD Attending Center for [employer] (Faculty Practice) 01/25/2021

## 2021-01-27 ENCOUNTER — Telehealth: Payer: Self-pay | Admitting: Genetics

## 2021-01-27 LAB — HGB FRACTIONATION CASCADE
Hgb A2: 2.6 % (ref 1.8–3.2)
Hgb A: 97.4 % (ref 96.4–98.8)
Hgb F: 0 % (ref 0.0–2.0)
Hgb S: 0 %

## 2021-01-27 LAB — CHROMOSOME MICROARRAY REFLEX, AMN FLD

## 2021-01-27 LAB — INHERITEST(R) CF/SMA PANEL

## 2021-01-27 LAB — CHROMOSOME ANALYSIS W REFLEX TO SNP, AMNIOTIC
Cells Analyzed: 15
Cells Counted: 15
Cells Karyotyped: 2
Colonies: 15
GTG Band Resolution Achieved: 450

## 2021-01-27 LAB — AFP, AMNIOTIC FLUID
AFP, Amniotic Fluid (mcg/ml): 7.9 ug/mL
Gestational Age(Wks): 20
MOM, Amniotic Fluid: 1.19

## 2021-01-27 LAB — MATERNAL CELL CONTAMINATION

## 2021-01-27 NOTE — Telephone Encounter (Signed)
Called [place] to return amniocentesis microarray results. Left voicemail with [facility] call back number.

## 2021-02-03 ENCOUNTER — Other Ambulatory Visit: Payer: Self-pay

## 2021-02-03 ENCOUNTER — Ambulatory Visit (INDEPENDENT_AMBULATORY_CARE_PROVIDER_SITE_OTHER): Payer: BLUE CROSS/BLUE SHIELD | Admitting: Advanced Practice Midwife

## 2021-02-03 ENCOUNTER — Encounter: Payer: Self-pay | Admitting: Advanced Practice Midwife

## 2021-02-03 VITALS — BP 108/72 | HR 78 | Wt 129.0 lb

## 2021-02-03 DIAGNOSIS — O0990 Supervision of high risk pregnancy, unspecified, unspecified trimester: Secondary | ICD-10-CM

## 2021-02-03 DIAGNOSIS — O280 Abnormal hematological finding on antenatal screening of mother: Secondary | ICD-10-CM

## 2021-02-03 DIAGNOSIS — Z3A25 25 weeks gestation of pregnancy: Secondary | ICD-10-CM

## 2021-02-03 NOTE — Progress Notes (Signed)
° °  PRENATAL VISIT NOTE  Subjective:  Dana Mcmillan is a 26 y.o. G1P0 at [redacted]w[redacted]d being seen today for ongoing prenatal care.  She is currently monitored for the following issues for this low-risk pregnancy and has Supervision of high risk pregnancy, antepartum; Family history of clubfoot in FOB; Asymptomatic bacteriuria during pregnancy in first trimester; Abnormal antenatal AFP screen; Cystic fibrosis carrier; and Club foot of fetus affecting antepartum care of mother on their problem list.  Patient reports no complaints.  Contractions: Not present. Vag. Bleeding: None.  Movement: Present. Denies leaking of fluid.   The following portions of the patient's history were reviewed and updated as appropriate: allergies, current medications, past family history, past medical history, past social history, past surgical history and problem list. Problem list updated.  Objective:   Vitals:   02/03/21 1107  BP: 108/72  Pulse: 78  Weight: 129 lb (58.5 kg)    Fetal Status: Fetal Heart Rate (bpm): 152 Fundal Height: 25 cm Movement: Present     General:  Alert, oriented and cooperative. Patient is in no acute distress.  Skin: Skin is warm and dry. No rash noted.   Cardiovascular: Normal heart rate noted  Respiratory: Normal respiratory effort, no problems with respiration noted  Abdomen: Soft, gravid, appropriate for gestational age.  Pain/Pressure: Absent     Pelvic: Cervical exam deferred        Extremities: Normal range of motion.  Edema: Trace  Mental Status: Normal mood and affect. Normal behavior. Normal judgment and thought content.   Assessment and Plan:  Pregnancy: G1P0 at [redacted]w[redacted]d  1. Supervision of high risk pregnancy, antepartum - routine care, GTT next visit. Reviewed timeline for appointment  2. [redacted] weeks gestation of pregnancy   3. Abnormal antenatal AFP screen - S/p counseling with Genetics, no questions at this time  Preterm labor symptoms and general obstetric precautions  including but not limited to vaginal bleeding, contractions, leaking of fluid and fetal movement were reviewed in detail with the patient. Please refer to After Visit Summary for other counseling recommendations.  No follow-ups on file.  Future Appointments  Date Time Provider [place]  02/22/2021  8:35 AM Anyanwu, Sallyanne Havers, MD [facility]  02/22/2021  3:30 PM [facility] NURSE [facility]  02/22/2021  3:45 PM [facility] US5 [facility] [place], CNM

## 2021-02-03 NOTE — Progress Notes (Signed)
ROB [redacted]w[redacted]d  CC: None

## 2021-02-18 ENCOUNTER — Other Ambulatory Visit: Payer: Self-pay | Admitting: Obstetrics & Gynecology

## 2021-02-18 DIAGNOSIS — O219 Vomiting of pregnancy, unspecified: Secondary | ICD-10-CM

## 2021-02-22 ENCOUNTER — Ambulatory Visit: Payer: BLUE CROSS/BLUE SHIELD

## 2021-02-22 ENCOUNTER — Telehealth: Payer: Self-pay

## 2021-02-22 ENCOUNTER — Encounter: Payer: Self-pay | Admitting: Obstetrics & Gynecology

## 2021-02-22 ENCOUNTER — Ambulatory Visit (INDEPENDENT_AMBULATORY_CARE_PROVIDER_SITE_OTHER): Payer: BLUE CROSS/BLUE SHIELD | Admitting: Obstetrics & Gynecology

## 2021-02-22 ENCOUNTER — Other Ambulatory Visit: Payer: Self-pay

## 2021-02-22 VITALS — BP 120/72 | HR 84 | Wt 134.0 lb

## 2021-02-22 DIAGNOSIS — Z3A28 28 weeks gestation of pregnancy: Secondary | ICD-10-CM

## 2021-02-22 DIAGNOSIS — Z23 Encounter for immunization: Secondary | ICD-10-CM

## 2021-02-22 DIAGNOSIS — O35HXX0 Maternal care for other (suspected) fetal abnormality and damage, fetal lower extremities anomalies, not applicable or unspecified: Secondary | ICD-10-CM

## 2021-02-22 DIAGNOSIS — O280 Abnormal hematological finding on antenatal screening of mother: Secondary | ICD-10-CM

## 2021-02-22 DIAGNOSIS — O0990 Supervision of high risk pregnancy, unspecified, unspecified trimester: Secondary | ICD-10-CM

## 2021-02-22 NOTE — Progress Notes (Signed)
° °  PRENATAL VISIT NOTE  Subjective:  Dana Mcmillan is a 26 y.o. G1P0 at [redacted]w[redacted]d being seen today for ongoing prenatal care.  She is currently monitored for the following issues for this high-risk pregnancy and has Supervision of high risk pregnancy, antepartum; Family history of clubfoot in FOB; Asymptomatic bacteriuria during pregnancy in first trimester; Abnormal antenatal AFP screen; Cystic fibrosis carrier; and Club foot of fetus affecting antepartum care of mother on their problem list.  Patient reports no complaints.  Contractions: Not present. Vag. Bleeding: None.  Movement: Present. Denies leaking of fluid.   The following portions of the patient's history were reviewed and updated as appropriate: allergies, current medications, past family history, past medical history, past social history, past surgical history and problem list.   Objective:   Vitals:   02/22/21 0844  BP: 120/72  Pulse: 84  Weight: 134 lb (60.8 kg)    Fetal Status: Fetal Heart Rate (bpm): 145    Movement: Present     General:  Alert, oriented and cooperative. Patient is in no acute distress.  Skin: Skin is warm and dry. No rash noted.   Cardiovascular: Normal heart rate noted  Respiratory: Normal respiratory effort, no problems with respiration noted  Abdomen: Soft, gravid, appropriate for gestational age.  Pain/Pressure: Absent     Pelvic: Cervical exam deferred        Extremities: Normal range of motion.  Edema: Trace  Mental Status: Normal mood and affect. Normal behavior. Normal judgment and thought content.   Assessment and Plan:  Pregnancy: G1P0 at [redacted]w[redacted]d 1. Abnormal antenatal AFP screen 2. Club foot of fetus affecting antepartum care of mother, single or unspecified fetus Follow up MFM scans, next one is today.  3. Need for Tdap vaccination Tdap given.  4. [redacted] weeks gestation of pregnancy 5. Supervision of high risk pregnancy, antepartum Third trimester labs today, will follow up results and  manage accordingly. - Glucose Tolerance, 2 Hours w/1 Hour - RPR - HIV antibody (with reflex) - CBC Preterm labor symptoms and general obstetric precautions including but not limited to vaginal bleeding, contractions, leaking of fluid and fetal movement were reviewed in detail with the patient. Please refer to After Visit Summary for other counseling recommendations.   Return in about 2 weeks (around 03/08/2021) for OFFICE OB VISIT (MD or APP).  Future Appointments  Date Time Provider [place]  02/22/2021  3:30 PM [facility] NURSE [facility]  02/22/2021  3:45 PM [facility] US5 [facility] [place]    Verita Schneiders, MD

## 2021-02-23 ENCOUNTER — Ambulatory Visit: Payer: BLUE CROSS/BLUE SHIELD | Attending: Maternal & Fetal Medicine

## 2021-02-23 ENCOUNTER — Ambulatory Visit: Payer: BLUE CROSS/BLUE SHIELD | Admitting: *Deleted

## 2021-02-23 VITALS — BP 115/70 | HR 81

## 2021-02-23 DIAGNOSIS — O35HXX0 Maternal care for other (suspected) fetal abnormality and damage, fetal lower extremities anomalies, not applicable or unspecified: Secondary | ICD-10-CM | POA: Diagnosis present

## 2021-02-23 DIAGNOSIS — O99333 Smoking (tobacco) complicating pregnancy, third trimester: Secondary | ICD-10-CM

## 2021-02-23 DIAGNOSIS — O0990 Supervision of high risk pregnancy, unspecified, unspecified trimester: Secondary | ICD-10-CM | POA: Insufficient documentation

## 2021-02-23 DIAGNOSIS — O99332 Smoking (tobacco) complicating pregnancy, second trimester: Secondary | ICD-10-CM | POA: Insufficient documentation

## 2021-02-23 DIAGNOSIS — Z3A28 28 weeks gestation of pregnancy: Secondary | ICD-10-CM

## 2021-02-23 DIAGNOSIS — R772 Abnormality of alphafetoprotein: Secondary | ICD-10-CM | POA: Insufficient documentation

## 2021-02-23 LAB — CBC
Hematocrit: 37.4 % (ref 34.0–46.6)
Hemoglobin: 12.8 g/dL (ref 11.1–15.9)
MCH: 32 pg (ref 26.6–33.0)
MCHC: 34.2 g/dL (ref 31.5–35.7)
MCV: 94 fL (ref 79–97)
Platelets: 202 10*3/uL (ref 150–450)
RBC: 4 x10E6/uL (ref 3.77–5.28)
RDW: 11.7 % (ref 11.7–15.4)
WBC: 11 10*3/uL — ABNORMAL HIGH (ref 3.4–10.8)

## 2021-02-23 LAB — HIV ANTIBODY (ROUTINE TESTING W REFLEX): HIV Screen 4th Generation wRfx: NONREACTIVE

## 2021-02-23 LAB — GLUCOSE TOLERANCE, 2 HOURS W/ 1HR
Glucose, 1 hour: 106 mg/dL (ref 70–179)
Glucose, 2 hour: 95 mg/dL (ref 70–152)
Glucose, Fasting: 78 mg/dL (ref 70–91)

## 2021-02-23 LAB — RPR: RPR Ser Ql: NONREACTIVE

## 2021-02-24 ENCOUNTER — Other Ambulatory Visit: Payer: Self-pay | Admitting: *Deleted

## 2021-02-24 DIAGNOSIS — Z362 Encounter for other antenatal screening follow-up: Secondary | ICD-10-CM

## 2021-02-24 DIAGNOSIS — O35HXX0 Maternal care for other (suspected) fetal abnormality and damage, fetal lower extremities anomalies, not applicable or unspecified: Secondary | ICD-10-CM

## 2021-02-24 DIAGNOSIS — Z3689 Encounter for other specified antenatal screening: Secondary | ICD-10-CM

## 2021-03-09 ENCOUNTER — Other Ambulatory Visit: Payer: Self-pay

## 2021-03-09 ENCOUNTER — Ambulatory Visit (INDEPENDENT_AMBULATORY_CARE_PROVIDER_SITE_OTHER): Payer: Self-pay | Admitting: Obstetrics and Gynecology

## 2021-03-09 VITALS — BP 118/76 | HR 86 | Wt 136.0 lb

## 2021-03-09 DIAGNOSIS — O0993 Supervision of high risk pregnancy, unspecified, third trimester: Secondary | ICD-10-CM

## 2021-03-09 DIAGNOSIS — O0990 Supervision of high risk pregnancy, unspecified, unspecified trimester: Secondary | ICD-10-CM

## 2021-03-09 DIAGNOSIS — R8271 Bacteriuria: Secondary | ICD-10-CM

## 2021-03-09 DIAGNOSIS — Z0374 Encounter for suspected problem with fetal growth ruled out: Secondary | ICD-10-CM

## 2021-03-09 DIAGNOSIS — O280 Abnormal hematological finding on antenatal screening of mother: Secondary | ICD-10-CM

## 2021-03-09 DIAGNOSIS — O99891 Other specified diseases and conditions complicating pregnancy: Secondary | ICD-10-CM

## 2021-03-09 DIAGNOSIS — Z3A30 30 weeks gestation of pregnancy: Secondary | ICD-10-CM

## 2021-03-09 NOTE — Progress Notes (Signed)
° ° °  PRENATAL VISIT NOTE  Subjective:  Dana Mcmillan is a 26 y.o. G1P0 at [redacted]w[redacted]d being seen today for ongoing prenatal care.  She is currently monitored for the following issues for this high-risk pregnancy and has Supervision of high risk pregnancy, antepartum; Family history of clubfoot in FOB; Asymptomatic bacteriuria during pregnancy in first trimester; Abnormal antenatal AFP screen; Cystic fibrosis carrier; and Club foot of fetus affecting antepartum care of mother on their problem list.  Patient reports no complaints.  Contractions: Not present. Vag. Bleeding: None.  Movement: Present. Denies leaking of fluid.   The following portions of the patient's history were reviewed and updated as appropriate: allergies, current medications, past family history, past medical history, past social history, past surgical history and problem list.   Objective:   Vitals:   03/09/21 1608  BP: 118/76  Pulse: 86  Weight: 136 lb (61.7 kg)    Fetal Status: Fetal Heart Rate (bpm): 142   Movement: Present     General:  Alert, oriented and cooperative. Patient is in no acute distress.  Skin: Skin is warm and dry. No rash noted.   Cardiovascular: Normal heart rate noted  Respiratory: Normal respiratory effort, no problems with respiration noted  Abdomen: Soft, gravid, appropriate for gestational age.  Pain/Pressure: Absent     Pelvic: Cervical exam deferred        Extremities: Normal range of motion.  Edema: Trace  Mental Status: Normal mood and affect. Normal behavior. Normal judgment and thought content.   Assessment and Plan:  Pregnancy: G1P0 at [redacted]w[redacted]d 1. Supervision of high risk pregnancy, antepartum S/p neg 28wk labs  2. Asymptomatic bacteriuria during pregnancy in first trimester Toc neg  3. Abnormal antenatal AFP screen, Fetal right club foot 2/1: efw 12%, 1088gm, ac 24%, afi 21 Follow up repeat growth on 3/1 -s/p 12/6 Amnio: AF-AFP normal, karyotype 46,XY, microarray wnl; unc fetal  echo wnl; panorama low risk; CF carrier  4. Borderline FGR FKC precautions. See above  Preterm labor symptoms and general obstetric precautions including but not limited to vaginal bleeding, contractions, leaking of fluid and fetal movement were reviewed in detail with the patient. Please refer to After Visit Summary for other counseling recommendations.   No follow-ups on file.  Future Appointments  Date Time Provider Department Center  03/23/2021 12:30 PM [facility] NURSE [facility]  03/23/2021 12:45 PM [facility] US4 [facility]  03/23/2021  2:30 PM Reva Bores, MD [facility]  04/06/2021  2:30 PM Reva Bores, MD [facility]  04/20/2021  3:50 PM Reva Bores, MD [facility]  04/27/2021  3:50 PM [place] Bing, MD [facility]  05/04/2021  3:50 PM [place] Bing, MD [facility]  05/11/2021  3:50 PM [place] Bing, MD [facility]  05/18/2021  3:50 PM Reva Bores, MD [facility]    [place] Bing, MD

## 2021-03-10 DIAGNOSIS — Z0374 Encounter for suspected problem with fetal growth ruled out: Secondary | ICD-10-CM | POA: Insufficient documentation

## 2021-03-19 ENCOUNTER — Other Ambulatory Visit: Payer: Self-pay | Admitting: Obstetrics & Gynecology

## 2021-03-19 DIAGNOSIS — O219 Vomiting of pregnancy, unspecified: Secondary | ICD-10-CM

## 2021-03-23 ENCOUNTER — Inpatient Hospital Stay (HOSPITAL_BASED_OUTPATIENT_CLINIC_OR_DEPARTMENT_OTHER): Payer: BLUE CROSS/BLUE SHIELD

## 2021-03-23 ENCOUNTER — Ambulatory Visit (HOSPITAL_BASED_OUTPATIENT_CLINIC_OR_DEPARTMENT_OTHER): Payer: BLUE CROSS/BLUE SHIELD

## 2021-03-23 ENCOUNTER — Other Ambulatory Visit: Payer: Self-pay | Admitting: *Deleted

## 2021-03-23 ENCOUNTER — Ambulatory Visit: Payer: BLUE CROSS/BLUE SHIELD | Admitting: *Deleted

## 2021-03-23 ENCOUNTER — Encounter (HOSPITAL_COMMUNITY): Payer: Self-pay | Admitting: Obstetrics & Gynecology

## 2021-03-23 ENCOUNTER — Ambulatory Visit (INDEPENDENT_AMBULATORY_CARE_PROVIDER_SITE_OTHER): Payer: BLUE CROSS/BLUE SHIELD | Admitting: Family Medicine

## 2021-03-23 ENCOUNTER — Other Ambulatory Visit: Payer: Self-pay

## 2021-03-23 ENCOUNTER — Inpatient Hospital Stay (HOSPITAL_COMMUNITY)
Admission: AD | Admit: 2021-03-23 | Discharge: 2021-03-24 | DRG: 833 | Disposition: A | Discharge status: Home / Self Care | Payer: BLUE CROSS/BLUE SHIELD | Attending: Obstetrics & Gynecology | Admitting: Obstetrics & Gynecology

## 2021-03-23 VITALS — BP 119/75 | HR 90 | Wt 137.0 lb

## 2021-03-23 VITALS — BP 122/71 | HR 88

## 2021-03-23 DIAGNOSIS — Z3689 Encounter for other specified antenatal screening: Secondary | ICD-10-CM

## 2021-03-23 DIAGNOSIS — O4693 Antepartum hemorrhage, unspecified, third trimester: Principal | ICD-10-CM | POA: Diagnosis present

## 2021-03-23 DIAGNOSIS — Z3A32 32 weeks gestation of pregnancy: Secondary | ICD-10-CM

## 2021-03-23 DIAGNOSIS — Q6689 Other  specified congenital deformities of feet: Secondary | ICD-10-CM

## 2021-03-23 DIAGNOSIS — O35HXX0 Maternal care for other (suspected) fetal abnormality and damage, fetal lower extremities anomalies, not applicable or unspecified: Secondary | ICD-10-CM | POA: Insufficient documentation

## 2021-03-23 DIAGNOSIS — O99333 Smoking (tobacco) complicating pregnancy, third trimester: Secondary | ICD-10-CM

## 2021-03-23 DIAGNOSIS — O0990 Supervision of high risk pregnancy, unspecified, unspecified trimester: Secondary | ICD-10-CM

## 2021-03-23 DIAGNOSIS — O358XX0 Maternal care for other (suspected) fetal abnormality and damage, not applicable or unspecified: Secondary | ICD-10-CM | POA: Diagnosis present

## 2021-03-23 DIAGNOSIS — B001 Herpesviral vesicular dermatitis: Secondary | ICD-10-CM

## 2021-03-23 DIAGNOSIS — Z87891 Personal history of nicotine dependence: Secondary | ICD-10-CM | POA: Diagnosis not present

## 2021-03-23 DIAGNOSIS — Z141 Cystic fibrosis carrier: Secondary | ICD-10-CM | POA: Diagnosis not present

## 2021-03-23 DIAGNOSIS — Z8279 Family history of other congenital malformations, deformations and chromosomal abnormalities: Secondary | ICD-10-CM

## 2021-03-23 DIAGNOSIS — R772 Abnormality of alphafetoprotein: Secondary | ICD-10-CM

## 2021-03-23 DIAGNOSIS — Z20822 Contact with and (suspected) exposure to covid-19: Secondary | ICD-10-CM | POA: Diagnosis present

## 2021-03-23 DIAGNOSIS — Z362 Encounter for other antenatal screening follow-up: Secondary | ICD-10-CM

## 2021-03-23 DIAGNOSIS — Z0374 Encounter for suspected problem with fetal growth ruled out: Secondary | ICD-10-CM

## 2021-03-23 DIAGNOSIS — O4690 Antepartum hemorrhage, unspecified, unspecified trimester: Secondary | ICD-10-CM

## 2021-03-23 LAB — TYPE AND SCREEN
ABO/RH(D): O POS
Antibody Screen: NEGATIVE

## 2021-03-23 LAB — CBC
HCT: 36.8 % (ref 36.0–46.0)
Hemoglobin: 12.7 g/dL (ref 12.0–15.0)
MCH: 32.2 pg (ref 26.0–34.0)
MCHC: 34.5 g/dL (ref 30.0–36.0)
MCV: 93.4 fL (ref 80.0–100.0)
Platelets: 218 10*3/uL (ref 150–400)
RBC: 3.94 MIL/uL (ref 3.87–5.11)
RDW: 12.6 % (ref 11.5–15.5)
WBC: 11.5 10*3/uL — ABNORMAL HIGH (ref 4.0–10.5)
nRBC: 0 % (ref 0.0–0.2)

## 2021-03-23 MED ORDER — CALCIUM CARBONATE ANTACID 500 MG PO CHEW: 2.0000 | CHEWABLE_TABLET | ORAL | Status: DC | PRN

## 2021-03-23 MED ORDER — ONDANSETRON HCL 4 MG PO TABS
4.0000 mg | ORAL_TABLET | Freq: Three times a day (TID) | ORAL | Status: DC | PRN
  Administered 2021-03-23: 4 mg via ORAL
  Filled 2021-03-23: qty 1

## 2021-03-23 MED ORDER — ACETAMINOPHEN 325 MG PO TABS: 650.0000 mg | ORAL_TABLET | ORAL | Status: DC | PRN

## 2021-03-23 MED ORDER — VALACYCLOVIR HCL 1 G PO TABS: 1000.0000 mg | ORAL_TABLET | Freq: Two times a day (BID) | ORAL | 2 refills | Status: DC

## 2021-03-23 MED ORDER — ZOLPIDEM TARTRATE 5 MG PO TABS: 5.0000 mg | ORAL_TABLET | Freq: Every evening | ORAL | Status: DC | PRN

## 2021-03-23 MED ORDER — PANTOPRAZOLE SODIUM 20 MG PO TBEC
20.0000 mg | DELAYED_RELEASE_TABLET | Freq: Every day | ORAL | Status: DC
  Administered 2021-03-23: 20 mg via ORAL
  Filled 2021-03-23 (×2): qty 1

## 2021-03-23 MED ORDER — BETAMETHASONE SOD PHOS & ACET 6 (3-3) MG/ML IJ SUSP
12.0000 mg | INTRAMUSCULAR | Status: AC
  Administered 2021-03-23 – 2021-03-24 (×2): 12 mg via INTRAMUSCULAR
  Filled 2021-03-23: qty 5

## 2021-03-23 MED ORDER — DOCUSATE SODIUM 100 MG PO CAPS
100.0000 mg | ORAL_CAPSULE | Freq: Every day | ORAL | Status: DC
  Filled 2021-03-23: qty 1

## 2021-03-23 MED ORDER — PRENATAL MULTIVITAMIN CH
1.0000 | ORAL_TABLET | Freq: Every day | ORAL | Status: DC
  Filled 2021-03-23: qty 1

## 2021-03-23 NOTE — Progress Notes (Signed)
? ?  PRENATAL VISIT NOTE ? ?Subjective:  ?Dana Mcmillan is a 26 y.o. G1P0 at [redacted]w[redacted]d being seen today for ongoing prenatal care.  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 ? ?Patient reports no complaints.  Contractions: Not present. Vag. Bleeding: None.  Movement: Present. Denies leaking of fluid.  ? ?The following portions of the patient's history were reviewed and updated as appropriate: allergies, current medications, past family history, past medical history, past social history, past surgical history and problem list.  ? ?Objective:  ? ?Vitals:  ? 03/23/21 1443  ?BP: 119/75  ?Pulse: 90  ?Weight: 137 lb (62.1 kg)  ? ? ?Fetal Status: Fetal Heart Rate (bpm): 150 Fundal Height: 30 cm Movement: Present    ? ?General:  Alert, oriented and cooperative. Patient is in no acute distress.  ?Skin: Skin is warm and dry. No rash noted.   ?Cardiovascular: Normal heart rate noted  ?Respiratory: Normal respiratory effort, no problems with respiration noted  ?Abdomen: Soft, gravid, appropriate for gestational age.  Pain/Pressure: Absent     ?Pelvic: Cervical exam deferred        ?Extremities: Normal range of motion.  Edema: None  ?Mental Status: Normal mood and affect. Normal behavior. Normal judgment and thought content.  ? ?Assessment and Plan:  ?Pregnancy: G1P0 at [redacted]w[redacted]d ?1. Supervision of high risk pregnancy,  antepartum ?Continue prenatal care. ? ?2. Cystic fibrosis carrier ?Partner is negative ? ?3. Club foot of fetus affecting antepartum care of mother, single or unspecified fetus ?To see ortho ? ?4. Fetal growth problem suspected but not found ?EFW today at 25% ? ?5. Cold sore ?Has today--rx send in  ?- valACYclovir (VALTREX) 1000 MG tablet; Take 1 tablet (1,000 mg total) by mouth 2 (two) times daily.  Dispense: 10 tablet; Refill: 2 ? ?Preterm labor symptoms and general obstetric precautions including but not limited to vaginal bleeding, contractions, leaking of fluid and fetal movement were reviewed in detail with the patient. ?Please refer to After Visit Summary for other counseling recommendations.  ? ?Return in 2 weeks (on 04/06/2021). ? ?Future Appointments  ?Date Time Provider [place]  ?04/06/2021  2:30 PM Donnamae Jude, MD [facility]  ?04/20/2021  1:15 PM [facility] NURSE [facility]  ?04/20/2021  1:30 PM [facility] US3 [facility]  ?04/20/2021  3:50 PM Donnamae Jude, MD [facility]  ?04/27/2021  3:50 PM Aletha Halim, MD [facility]  ?05/04/2021  3:50 PM Aletha Halim, MD [facility]  ?05/11/2021  3:50 PM Aletha Halim, MD [facility]  ?05/18/2021  3:50 PM Donnamae Jude, MD [facility]  ? ? ?Donnamae Jude, MD ? ?

## 2021-03-23 NOTE — H&P (Signed)
OBSTETRIC ADMISSION HISTORY AND PHYSICAL ? ?Dana Mcmillan is a 26 y.o. female G1P0 with IUP at [redacted]w[redacted]d with VB and cramping. Reports onset of large amt of bright red bleeding down her thighs and passed clots just after IC around 1645. Since then she is having cramping in her lower abdomen. Rates pain 8/10. Reports normal FM until she started bleeding the hasn't felt any.  ? ?Dating: By LMP/7w [place] --->  Estimated Date of Delivery: 05/14/21 ? ?Sono:   ? ?$R'@[redacted]w[redacted]d'Qi$ , posterior placenta, Rt club foot, short long bones, breech presentation, 1823g, 20%ile, EFW 4' ? ?Prenatal History/Complications: ?-elevated AFP, normal amnio ?-Rt club foot ?-short long bones ?-fetal CPC, resolved ? ?Past Medical History: ?Past Medical History:  ?Diagnosis Date  ? Anxiety   ? MDD (major depressive disorder)   ? Panic disorder with agoraphobia and moderate panic attacks   ? ? ?Past Surgical History: ?Past Surgical History:  ?Procedure Laterality Date  ? ANKLE SURGERY Right 09/2019  ? WISDOM TOOTH EXTRACTION  2017  ? ? ?Obstetrical History: ?OB History   ? ? Gravida  ?1  ? Para  ?   ? Term  ?   ? Preterm  ?   ? AB  ?   ? Living  ?   ?  ? ? SAB  ?   ? IAB  ?   ? Ectopic  ?   ? Multiple  ?   ? Live Births  ?   ?   ?  ?  ? ? ?Social History: ?Social History  ? ?Socioeconomic History  ? Marital status: Single  ?  Spouse name: Not on file  ? Number of children: Not on file  ? Years of education: Not on file  ? Highest education level: Not on file  ?Occupational History  ? Not on file  ?Tobacco Use  ? Smoking status: Former  ?  Types: Cigarettes  ?  Quit date: 01/04/2019  ?  Years since quitting: 2.2  ?  Passive exposure: Never  ? Smokeless tobacco: Never  ?Vaping Use  ? Vaping Use: Every day  ? Substances: Nicotine, Flavoring  ?Substance and Sexual Activity  ? Alcohol use: Not Currently  ?  Comment: not since confirmed pregnancy  ? Drug use: Not Currently  ?  Comment: not since confirmed pregnancy  ? Sexual activity: Yes  ?  Partners: Male  ?  Birth  control/protection: None  ?Other Topics Concern  ? Not on file  ?Social History Narrative  ? Not on file  ? ?Social Determinants of Health  ? ?Financial Resource Strain: Not on file  ?Food Insecurity: Not on file  ?Transportation Needs: Not on file  ?Physical Activity: Not on file  ?Stress: Not on file  ?Social Connections: Not on file  ? ? ?Family History: ?Family History  ?Problem Relation Age of Onset  ? Prostate cancer Paternal Grandfather   ? ? ?Allergies: ?Allergies  ?Allergen Reactions  ? Latex   ? Other Rash  ? ? ?Medications Prior to Admission  ?Medication Sig Dispense Refill Last Dose  ? ondansetron (ZOFRAN-ODT) 4 MG disintegrating tablet TAKE 1-2 TABLETS (4-8 MG TOTAL) BY MOUTH EVERY 6 (SIX) HOURS AS NEEDED FOR NAUSEA OR VOMITING. 20 tablet 3 03/23/2021  ? Prenatal MV & Min w/FA-DHA (PRENATAL GUMMIES PO) Take by mouth.   03/23/2021  ? Blood Pressure KIT 1 Device by Does not apply route once a week. To be monitored from home (Patient not taking: Reported on 12/28/2020) 1  kit 0   ? valACYclovir (VALTREX) 1000 MG tablet Take 1 tablet (1,000 mg total) by mouth 2 (two) times daily. 10 tablet 2   ? ? ? ?Review of Systems: ? ?All systems reviewed and negative except as stated in HPI ? ?PE: ?Blood pressure 122/87, pulse 99, temperature 98 ?F (36.7 ?C), temperature source Oral, resp. rate 16, height 5' 6" (1.676 m), weight 61.4 kg, last menstrual period 08/07/2020, SpO2 97 %. ?General appearance: alert, cooperative, and no distress ?Lungs: regular rate and effort ?Heart: regular rate  ?Abdomen: soft, non-tender ?Extremities: Homans sign is negative, no sign of DVT ?Presentation: breech ?EFM: 130 bpm, mod variability, + accels, no decels ?Toco: UI ?Dilation: Closed ?Exam by:: Julianne Handler CNM ? ?External: no lesions or erythema ?Vagina: rugated, pink, moist, moderate amt bloody discharge, cleared with 2 fox swabs, no active bleeding ?Cervix closed/thick ? ?Prenatal labs: ?ABO, Rh: O/Positive/-- (10/10  1459) ?Antibody: Negative (10/10 1459) ?Rubella: 2.04 (10/10 1459) ?RPR: Non Reactive (01/31 0857)  ?HBsAg: Negative (10/10 1459)  ?HIV: Non Reactive (01/31 0857)  ?GBS:  n/a ?2 hr GTT nml ? ?Prenatal Transfer Tool  ?Maternal Diabetes: No ?Genetic Screening: Abnormal:  Results: Elevated AFP ?Maternal Ultrasounds/Referrals: Other: ?Fetal Ultrasounds or other Referrals:  Referred to [facility]  ?Maternal Substance Abuse:  No ?Significant Maternal Medications:  None ?Significant Maternal Lab Results: None ? ?Results for orders placed or performed during the hospital encounter of 03/23/21 (from the past 24 hour(s))  ?CBC  ? Collection Time: 03/23/21  6:19 PM  ?Result Value Ref Range  ? WBC 11.5 (H) 4.0 - 10.5 K/uL  ? RBC 3.94 3.87 - 5.11 MIL/uL  ? Hemoglobin 12.7 12.0 - 15.0 g/dL  ? HCT 36.8 36.0 - 46.0 %  ? MCV 93.4 80.0 - 100.0 fL  ? MCH 32.2 26.0 - 34.0 pg  ? MCHC 34.5 30.0 - 36.0 g/dL  ? RDW 12.6 11.5 - 15.5 %  ? Platelets 218 150 - 400 K/uL  ? nRBC 0.0 0.0 - 0.2 %  ? ? ?Patient Active Problem List  ? Diagnosis Date Noted  ? Fetal growth problem suspected but not found 03/10/2021  ? 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 Abnormal antenatal AFP screen 12/01/2020  ? Asymptomatic bacteriuria during pregnancy in first trimester 11/05/2020  ? Family history of clubfoot in FOB 11/03/2020  ? Supervision of high risk pregnancy, antepartum 10/05/2020  ? Displaced bimalleolar fracture of right lower leg, initial encounter for closed fracture 10/14/2019  ? Mild scoliosis 12/09/2014  ? 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 MDD (major depressive disorder), single episode, moderate ([place]) 12/10/2012  ? ?Assessment: ?[redacted] weeks gestation ?Reactive NST ?Vaginal bleeding ?  ?Plan: ?Admit to [facility] unit ?Mngt per Dr. Roselie Awkward ? ?Julianne Handler, CNM  ?03/23/2021, 7:01 PM ? ? ? ? ?

## 2021-03-23 NOTE — MAU Note (Signed)
Dana Mcmillan is a 26 y.o. at [redacted]w[redacted]d here in MAU reporting: states prior to coming to MAU she went to the bathroom she noted heavy bleeding with some clots. States she put some toilet paper into her panties and it has bleeding on it. Having lower abdominal cramping, more on the right. No LOF. DFM today. ? ?Onset of complaint: today ? ?Pain score: 8/10 ? ?Vitals:  ? 03/23/21 1735  ?BP: 121/76  ?Pulse: (!) 108  ?Resp: 18  ?Temp: 98.1 ?F (36.7 ?C)  ?SpO2: 97%  ?   ?FHT:143 ? ?Lab orders placed from triage: UA ? ?

## 2021-03-23 NOTE — Patient Instructions (Signed)

## 2021-03-24 DIAGNOSIS — O4693 Antepartum hemorrhage, unspecified, third trimester: Principal | ICD-10-CM

## 2021-03-24 LAB — RESP PANEL BY RT-PCR (FLU A&B, COVID) ARPGX2
Influenza A by PCR: NEGATIVE
Influenza B by PCR: NEGATIVE
SARS Coronavirus 2 by RT PCR: NEGATIVE

## 2021-03-24 MED ORDER — ONDANSETRON 4 MG PO TBDP
4.0000 mg | ORAL_TABLET | Freq: Three times a day (TID) | ORAL | Status: DC | PRN
  Administered 2021-03-24: 4 mg via ORAL
  Filled 2021-03-24: qty 1

## 2021-03-24 NOTE — Progress Notes (Signed)
Patient ID: Dana N Nam, female   DOB: 1995/10/14, 26 y.o.   MRN: [id number] ?FACULTY PRACTICE ANTEPARTUM(COMPREHENSIVE) NOTE ? ?Dana Mcmillan is a 26 y.o. G1P0 at [redacted]w[redacted]d by LMP, early ultrasound who is admitted for post-coital bleeding.   ?Fetal presentation is breech. ?Length of Stay:  1  Days ? ?Subjective: ?cramping ?Patient reports the fetal movement as active. ?Patient reports uterine contraction  activity as cramping. ?Patient reports  vaginal bleeding as spotting. ?Patient describes fluid per vagina as None. ? ?Vitals:  Blood pressure (!) 101/59, pulse 74, temperature 97.8 ?F (36.6 ?C), temperature source Oral, resp. rate 16, height 5\' 6"  (1.676 m), weight 61.4 kg, last menstrual period 08/07/2020, SpO2 100 %. ?Physical Examination: ? General appearance - alert, well appearing, and in no distress ?Heart - normal rate and regular rhythm ?Abdomen - soft, nontender, nondistended ?Fundal Height:  size equals dates ?Cervical Exam: Not evaluated.  ?Extremities: extremities normal, atraumatic, no cyanosis or edema and Homans sign is negative, no sign of DVT with DTRs 2+ bilaterally ?Membranes:intact ? ?Fetal Monitoring:   ?Fetal Heart Rate A   ?Mode External filed at 03/24/2021 0645  ?Baseline Rate (A) 120 bpm filed at 03/24/2021 0645  ?Variability 6-25 BPM filed at 03/24/2021 0645  ?Accelerations 15 x 15 filed at 03/24/2021 0645  ?Decelerations Variable filed at 03/24/2021 05/24/2021  ? ? ?Labs:  ?Results for orders placed or performed during the hospital encounter of 03/23/21 (from the past 24 hour(s))  ?CBC  ? Collection Time: 03/23/21  6:19 PM  ?Result Value Ref Range  ? WBC 11.5 (H) 4.0 - 10.5 K/uL  ? RBC 3.94 3.87 - 5.11 MIL/uL  ? Hemoglobin 12.7 12.0 - 15.0 g/dL  ? HCT 36.8 36.0 - 46.0 %  ? MCV 93.4 80.0 - 100.0 fL  ? MCH 32.2 26.0 - 34.0 pg  ? MCHC 34.5 30.0 - 36.0 g/dL  ? RDW 12.6 11.5 - 15.5 %  ? Platelets 218 150 - 400 K/uL  ? nRBC 0.0 0.0 - 0.2 %  ?Type and screen [facility]  ? Collection  Time: 03/23/21  7:35 PM  ?Result Value Ref Range  ? ABO/RH(D) O POS   ? Antibody Screen NEG   ? Sample Expiration    ?  03/26/2021,2359 ?Performed at [facility], [address], [place], [place] ?  ?Resp Panel by RT-PCR (Flu A&B, Covid) Nasopharyngeal Swab  ? Collection Time: 03/24/21  4:08 AM  ? Specimen: Nasopharyngeal Swab; Nasopharyngeal(NP) swabs in vial transport medium  ?Result Value Ref Range  ? SARS Coronavirus 2 by RT PCR NEGATIVE NEGATIVE  ? Influenza A by PCR NEGATIVE NEGATIVE  ? Influenza B by PCR NEGATIVE NEGATIVE  ? ? ? ?Medications:  Scheduled ? betamethasone acetate-betamethasone sodium phosphate  12 mg Intramuscular Q24H  ? docusate sodium  100 mg Oral Daily  ? pantoprazole  20 mg Oral Daily  ? prenatal multivitamin  1 tablet Oral Q1200  ? ?I have reviewed the patient's current medications. ? ?ASSESSMENT: ?Patient Active Problem List  ? Diagnosis Date Noted  ? Vaginal bleeding in pregnancy, third trimester 03/23/2021  ? Fetal growth problem suspected but not found 03/10/2021  ? 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 Abnormal antenatal AFP screen 12/01/2020  ? Asymptomatic bacteriuria during pregnancy in first trimester 11/05/2020  ? Family history of clubfoot in FOB 11/03/2020  ? Supervision of high risk pregnancy, antepartum  10/05/2020  ? Displaced bimalleolar fracture of right lower leg, initial encounter for closed fracture 10/14/2019  ? Mild scoliosis 12/09/2014  ? 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 MDD (major depressive disorder), single episode, moderate (HCC) 12/10/2012  ? ? ?PLAN: ?Betamethasone injections x 2 ?Patient understands she will be recommended to be hospitalized for a time after her bleeding has stopped ? ?Scheryl Darter ?03/24/2021,7:15 AM ? ? ? ? ? ? ? ?

## 2021-03-24 NOTE — Discharge Summary (Signed)
Physician Discharge Summary  ?Patient ID: ?Dana Mcmillan ?MRN: [id number] ?DOB/AGE: 1995-07-30 25 y.o. ? ?Admit date: 03/23/2021 ?Discharge date: 03/24/2021 ? ?Admission Diagnoses: Vaginal bleeding in third trimester ? ?Discharge Diagnoses:  ?Principal Problem: ?  Vaginal bleeding in pregnancy, third trimester ? ? ?Discharged Condition: stable ? ?Hospital Course: 25yo G1P0@[redacted]w[redacted]d  who presented for post-coital bleeding.  Pt was monitored, bleeding improved and FHT remained reassuring.  No evidence of continued bleeding, labor or abruption.  Bleeding seemed related to intercourse and will plan to follow as outpatient. ? ?Consults: None ? ?Significant Diagnostic Studies:  ?Results for orders placed or performed during the hospital encounter of 03/23/21 (from the past 24 hour(s))  ?CBC     Status: Abnormal  ? Collection Time: 03/23/21  6:19 PM  ?Result Value Ref Range  ? WBC 11.5 (H) 4.0 - 10.5 K/uL  ? RBC 3.94 3.87 - 5.11 MIL/uL  ? Hemoglobin 12.7 12.0 - 15.0 g/dL  ? HCT 36.8 36.0 - 46.0 %  ? MCV 93.4 80.0 - 100.0 fL  ? MCH 32.2 26.0 - 34.0 pg  ? MCHC 34.5 30.0 - 36.0 g/dL  ? RDW 12.6 11.5 - 15.5 %  ? Platelets 218 150 - 400 K/uL  ? nRBC 0.0 0.0 - 0.2 %  ?Type and screen [place]     Status: None  ? Collection Time: 03/23/21  7:35 PM  ?Result Value Ref Range  ? ABO/RH(D) O POS   ? Antibody Screen NEG   ? Sample Expiration    ?  03/26/2021,2359 ?Performed at [facility], [place] [address], [place], [place] [address] ?  ?Resp Panel by RT-PCR (Flu A&B, Covid) Nasopharyngeal Swab     Status: None  ? Collection Time: 03/24/21  4:08 AM  ? Specimen: Nasopharyngeal Swab; Nasopharyngeal(NP) swabs in vial transport medium  ?Result Value Ref Range  ? SARS Coronavirus 2 by RT PCR NEGATIVE NEGATIVE  ? Influenza A by PCR NEGATIVE NEGATIVE  ? Influenza B by PCR NEGATIVE NEGATIVE  ? ? ? ?Treatments: FHT monitoring ? ?Discharge Exam: ?Blood pressure 108/62, pulse 72, temperature 98.2 ?F (36.8 ?C), temperature source  Oral, resp. rate 18, height 5' 6"  (1.676 m), weight 61.4 kg, last menstrual period 08/07/2020, SpO2 100 %. ?Physical Examination: General appearance - alert, well appearing, and in no distress ?Heart - normal rate and regular rhythm ?Abdomen - soft, nontender, nondistended ?Fundal Height:  size equals dates ?Cervical Exam: Not evaluated.  ?Extremities: extremities normal, atraumatic, no cyanosis or edema and Homans sign is negative, no sign of DVT with DTRs 2+ bilaterally ?Membranes:intact ? ?Disposition: Discharge disposition: 01-Home or Self Care ? ? ? ? ? ? ? ?Allergies as of 03/24/2021   ? ?   Reactions  ? Latex   ? Other Rash  ? ?  ? ?  ?Medication List  ?  ? ?TAKE these medications   ? ?Blood Pressure Kit ?1 Device by Does not apply route once a week. To be monitored from home ?  ?ondansetron 4 MG disintegrating tablet ?Commonly known as: ZOFRAN-ODT ?TAKE 1-2 TABLETS (4-8 MG TOTAL) BY MOUTH EVERY 6 (SIX) HOURS AS NEEDED FOR NAUSEA OR VOMITING. ?  ?PRENATAL GUMMIES PO ?Take by mouth. ?  ?valACYclovir 1000 MG tablet ?Commonly known as: VALTREX ?Take 1 tablet (1,000 mg total) by mouth 2 (two) times daily. ?  ? ?  ? ? Follow-up Information   ? ? [facility] for [employer] at [facility] Follow up.   ?  Specialty: Obstetrics and Gynecology ?Why: Follow up as scheduled ?Contact information: ?[address] ?[place] ?[phone] ? ?  ?  ? ?  ?  ? ?  ? ? ?Signed: ?Annalee Genta ?03/24/2021, 1:07 PM ? ? ?

## 2021-03-24 NOTE — Progress Notes (Signed)
Pt discharged in stable condition after d/c instructions given. All questions answered. Pt verbalized understanding. IV and EFM removed. Pt's home med, zofran, returned. Pt left with all her belongings.  ?

## 2021-03-24 NOTE — Progress Notes (Signed)
Upon assessment of pt, pt reports having a bout of nausea that she took home dose of Ondansetron for at  approximately 0400. W/ pt's consent, home med sent to pharmacy for dispensing and Dr. Roselie Awkward made aware.  ?

## 2021-03-24 NOTE — Progress Notes (Signed)
At bedside to review management plan.  Pt notes that her bleeding has improved and notes some brown spotting on her pad.  Denies pelvic or abdominal pain, denies regular contractions.  No LOF. +FM. ? ?ASSESSMENT: ?G1P0@ [redacted]w[redacted]d admitted due to postcoital vaginal bleeding ? ? ?PLAN: ?1) FWB- Cat. I reassuring ?2) Vaginal bleeding ?-bleeding has continued to improve with only brown spotting currently ?-discussed management- reviewed that pending clinical setting at time may consider prolonged inpatient monitoring.  In light of improved bleeding and episode triggered by intercourse, ok with discharge home later today ? ?Reviewed precautions,  questions and concerns were addressed.  As mentioned, plan for discharge home later today with outpatient follow up. ? ?Myna Hidalgo, DO ?Attending Obstetrician & Gynecologist, Faculty Practice ?Center for [employer], [facility] ? ?  ?

## 2021-03-30 ENCOUNTER — Telehealth: Payer: Self-pay | Admitting: Genetics

## 2021-03-30 NOTE — Telephone Encounter (Signed)
Per Dr. Zannie Kehr request genetic counseling called Dana Mcmillan to discuss a referral for an orthopedics consultation for club foot repair. Left Dana Mcmillan a voicemail with the [facility] phone number and asked Dana Mcmillan to let us know where she would like the referral sent.  ?

## 2021-03-31 ENCOUNTER — Telehealth: Payer: Self-pay | Admitting: Genetics

## 2021-03-31 NOTE — Telephone Encounter (Signed)
Called [place] to discuss pediatric orthopaedic consultation appointment. [place] would like to be referred to [place] for Pediatric Orthopaedics in [place]. Genetic counseling placed a call to this clinic and set up the referral on [place] behalf.  ?

## 2021-04-06 ENCOUNTER — Ambulatory Visit (INDEPENDENT_AMBULATORY_CARE_PROVIDER_SITE_OTHER): Payer: BLUE CROSS/BLUE SHIELD | Admitting: Family Medicine

## 2021-04-06 ENCOUNTER — Other Ambulatory Visit: Payer: Self-pay

## 2021-04-06 VITALS — BP 131/74 | HR 90 | Wt 136.4 lb

## 2021-04-06 DIAGNOSIS — O35HXX0 Maternal care for other (suspected) fetal abnormality and damage, fetal lower extremities anomalies, not applicable or unspecified: Secondary | ICD-10-CM

## 2021-04-06 DIAGNOSIS — O0990 Supervision of high risk pregnancy, unspecified, unspecified trimester: Secondary | ICD-10-CM

## 2021-04-06 NOTE — Progress Notes (Signed)
? ?  PRENATAL VISIT NOTE ? ?Subjective:  ?Dana Mcmillan is a 26 y.o. G1P0 at [redacted]w[redacted]d being seen today for ongoing prenatal care.  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 ? ?Patient reports no complaints.  Contractions: Irritability. Vag. Bleeding: None.  Movement: Present. Denies leaking of fluid.  ? ?The following portions of the patient's history were reviewed and updated as appropriate: allergies, current medications, past family history, past medical history, past social history, past surgical history and problem list.  ? ?Objective:  ? ?Vitals:  ? 04/06/21 1439  ?BP: 131/74  ?Pulse: 90  ?Weight: 136 lb 6.4 oz (61.9 kg)  ? ? ?Fetal Status: Fetal Heart Rate (bpm): 143 Fundal Height: 33 cm Movement: Present    ? ?General:  Alert, oriented and cooperative. Patient is in no acute distress.  ?Skin: Skin is warm and dry. No rash noted.   ?Cardiovascular: Normal heart rate noted  ?Respiratory: Normal respiratory effort, no problems with respiration noted  ?Abdomen: Soft, gravid, appropriate for gestational age.  Pain/Pressure: Absent     ?Pelvic: Cervical exam deferred        ?Extremities: Normal range of motion.     ?Mental Status: Normal mood and affect. Normal behavior. Normal judgment and thought content.  ? ?Assessment and Plan:  ?Pregnancy: G1P0 at [redacted]w[redacted]d ?1.  Supervision of high risk pregnancy, antepartum ?Continue routine prenatal care. ? ? ?2. Club foot of fetus affecting antepartum care of mother, single or unspecified fetus ? ? ?Preterm labor symptoms and general obstetric precautions including but not limited to vaginal bleeding, contractions, leaking of fluid and fetal movement were reviewed in detail with the patient. ?Please refer to After Visit Summary for other counseling recommendations.  ? ?Return in 2 weeks (on 04/20/2021). ? ?Future Appointments  ?Date Time Provider Department Center  ?04/20/2021  1:15 PM [facility] NURSE [facility]  ?04/20/2021  1:30 PM [facility] US3 [facility]  ?04/20/2021  3:50 PM Reva Bores, MD [facility]  ?04/27/2021  3:50 PM Rhodhiss Bing, MD [facility]  ?05/04/2021  3:50 PM Elba Bing, MD [facility]  ?05/11/2021  3:50 PM  Bing, MD [facility]  ?05/18/2021  3:50 PM Reva Bores, MD [facility]  ? ? ?Reva Bores, MD ? ?

## 2021-04-20 ENCOUNTER — Ambulatory Visit (INDEPENDENT_AMBULATORY_CARE_PROVIDER_SITE_OTHER): Payer: BLUE CROSS/BLUE SHIELD | Admitting: Family Medicine

## 2021-04-20 ENCOUNTER — Ambulatory Visit: Payer: BLUE CROSS/BLUE SHIELD | Attending: Obstetrics

## 2021-04-20 ENCOUNTER — Ambulatory Visit (HOSPITAL_BASED_OUTPATIENT_CLINIC_OR_DEPARTMENT_OTHER): Payer: BLUE CROSS/BLUE SHIELD | Admitting: *Deleted

## 2021-04-20 ENCOUNTER — Inpatient Hospital Stay (HOSPITAL_COMMUNITY): Admit: 2021-04-20 | Payer: BLUE CROSS/BLUE SHIELD

## 2021-04-20 ENCOUNTER — Ambulatory Visit: Payer: BLUE CROSS/BLUE SHIELD | Admitting: *Deleted

## 2021-04-20 VITALS — BP 121/77 | HR 106 | Wt 137.4 lb

## 2021-04-20 VITALS — BP 117/73 | HR 105

## 2021-04-20 DIAGNOSIS — O280 Abnormal hematological finding on antenatal screening of mother: Secondary | ICD-10-CM

## 2021-04-20 DIAGNOSIS — Z3A36 36 weeks gestation of pregnancy: Secondary | ICD-10-CM | POA: Diagnosis not present

## 2021-04-20 DIAGNOSIS — O4693 Antepartum hemorrhage, unspecified, third trimester: Secondary | ICD-10-CM

## 2021-04-20 DIAGNOSIS — O288 Other abnormal findings on antenatal screening of mother: Secondary | ICD-10-CM

## 2021-04-20 DIAGNOSIS — O35HXX0 Maternal care for other (suspected) fetal abnormality and damage, fetal lower extremities anomalies, not applicable or unspecified: Secondary | ICD-10-CM

## 2021-04-20 DIAGNOSIS — O0990 Supervision of high risk pregnancy, unspecified, unspecified trimester: Secondary | ICD-10-CM

## 2021-04-20 DIAGNOSIS — R772 Abnormality of alphafetoprotein: Secondary | ICD-10-CM | POA: Insufficient documentation

## 2021-04-20 DIAGNOSIS — Z8279 Family history of other congenital malformations, deformations and chromosomal abnormalities: Secondary | ICD-10-CM

## 2021-04-20 DIAGNOSIS — O99333 Smoking (tobacco) complicating pregnancy, third trimester: Secondary | ICD-10-CM

## 2021-04-20 DIAGNOSIS — F17200 Nicotine dependence, unspecified, uncomplicated: Secondary | ICD-10-CM

## 2021-04-20 DIAGNOSIS — Q6689 Other  specified congenital deformities of feet: Secondary | ICD-10-CM | POA: Diagnosis not present

## 2021-04-20 LAB — OB RESULTS CONSOLE GC/CHLAMYDIA: Gonorrhea: NEGATIVE

## 2021-04-20 NOTE — Procedures (Signed)
Dana Mcmillan ?03-24-1995 ?[redacted]w[redacted]d ? ?Fetus A Non-Stress Test Interpretation for 04/20/21 ? ?Indication:  elevated AFP ? ?Fetal Heart Rate A ?Mode: External ?Baseline Rate (A): 140 bpm ?Variability: Moderate ?Accelerations: 15 x 15 ?Decelerations: None ?Multiple birth?: No ? ?Uterine Activity ?Mode: Palpation, Toco ?Contraction Frequency (min): 1 uc with ui ?Contraction Duration (sec): 60 ?Contraction Quality: Mild ?Resting Tone Palpated: Relaxed ?Resting Time: Adequate ? ?Interpretation (Fetal Testing) ?Nonstress Test Interpretation: Reactive ?Comments: Dr. Parke Poisson reviewed tracing ? ? ?

## 2021-04-20 NOTE — Progress Notes (Signed)
? ?  PRENATAL VISIT NOTE ? ?Subjective:  ?Dana Mcmillan is a 26 y.o. G1P0 at [redacted]w[redacted]d being seen today for ongoing prenatal care.  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 ? ?Patient reports no complaints.  Contractions: Not present. Vag. Bleeding: None.  Movement: Present. Denies leaking of fluid.  ? ?The following portions of the patient's history were reviewed and updated as appropriate: allergies, current medications, past family history, past medical history, past social history, past surgical history and problem list.  ? ?Objective:  ? ?Vitals:  ? 04/20/21 1556  ?BP: 121/77  ?Pulse: (!) 106  ?Weight: 137 lb 6.4 oz (62.3 kg)  ? ? ?Fetal Status: Fetal Heart Rate (bpm): 141   Movement: Present  Presentation: Vertex ? ?General:  Alert, oriented and cooperative. Patient is in no acute distress.  ?Skin: Skin is warm and dry. No rash noted.   ?Cardiovascular: Normal heart rate noted  ?Respiratory: Normal respiratory effort, no problems with respiration noted  ?Abdomen: Soft, gravid, appropriate for gestational age.  Pain/Pressure: Absent     ?Pelvic: Cervical exam performed in the presence of a chaperone Dilation: 1 Effacement (%): 50 Station: -3, -2  ?Extremities: Normal range of motion.  Edema: None  ?Mental Status: Normal mood and affect. Normal behavior. Normal  judgment and thought content.  ? ?Assessment and Plan:  ?Pregnancy: G1P0 at [redacted]w[redacted]d ?1. Supervision of high risk pregnancy, antepartum ?Cultures today ?- Strep Gp B NAA ?- Cervicovaginal ancillary only ? ?2. Club foot of fetus affecting antepartum care of mother, single or unspecified fetus ? ? ?3. Abnormal alpha fetoprotein (AFP) level ?U/s today-->vtx, normal fluid, EFW 5 lb 8 oz (13%) ?Given degree of elevation, MFM recommends delivery at 37 weeks. She is 37 weeks on 4/1-->does not want this day--1st available date is 4/5--she will call back with preferred date. ?Orders placed ? ?Preterm labor symptoms and general obstetric precautions including but not limited to vaginal bleeding, contractions, leaking of fluid and fetal movement were reviewed in detail with the patient. ?Please refer to After Visit Summary for other counseling recommendations.  ? ?No follow-ups on file. ? ?Future Appointments  ?Date Time Provider [place]  ?04/25/2021 10:30 AM [facility] NURSE [facility]  ?04/25/2021 10:45 AM [facility] NST [facility]  ?04/27/2021  3:50 PM Aletha Halim, MD [facility]  ?05/02/2021  7:30 AM [facility] NURSE [facility]  ?05/02/2021  7:45 AM [facility] US6 [facility]  ?05/04/2021  3:50 PM Aletha Halim, MD [facility]  ?05/11/2021  3:50 PM Aletha Halim, MD [facility]  ?05/18/2021  3:50 PM Donnamae Jude, MD [facility]  ? ? ?Donnamae Jude, MD ? ?

## 2021-04-21 ENCOUNTER — Other Ambulatory Visit: Payer: Self-pay | Admitting: *Deleted

## 2021-04-21 DIAGNOSIS — R772 Abnormality of alphafetoprotein: Secondary | ICD-10-CM

## 2021-04-22 ENCOUNTER — Encounter (HOSPITAL_COMMUNITY): Payer: Self-pay | Admitting: *Deleted

## 2021-04-22 ENCOUNTER — Telehealth (HOSPITAL_COMMUNITY): Payer: Self-pay | Admitting: *Deleted

## 2021-04-22 LAB — CERVICOVAGINAL ANCILLARY ONLY
Chlamydia: NEGATIVE
Comment: NEGATIVE
Comment: NORMAL
Neisseria Gonorrhea: NEGATIVE

## 2021-04-22 LAB — STREP GP B NAA: Strep Gp B NAA: NEGATIVE

## 2021-04-22 NOTE — Telephone Encounter (Signed)
Preadmission screen  

## 2021-04-25 ENCOUNTER — Ambulatory Visit: Payer: BLUE CROSS/BLUE SHIELD | Attending: Obstetrics | Admitting: *Deleted

## 2021-04-25 ENCOUNTER — Encounter: Payer: Self-pay | Admitting: *Deleted

## 2021-04-25 ENCOUNTER — Ambulatory Visit (HOSPITAL_BASED_OUTPATIENT_CLINIC_OR_DEPARTMENT_OTHER): Payer: BLUE CROSS/BLUE SHIELD | Admitting: *Deleted

## 2021-04-25 VITALS — BP 120/70 | HR 77

## 2021-04-25 DIAGNOSIS — R772 Abnormality of alphafetoprotein: Secondary | ICD-10-CM | POA: Diagnosis not present

## 2021-04-25 DIAGNOSIS — Z3A36 36 weeks gestation of pregnancy: Secondary | ICD-10-CM

## 2021-04-25 DIAGNOSIS — Z3A37 37 weeks gestation of pregnancy: Secondary | ICD-10-CM | POA: Diagnosis not present

## 2021-04-25 DIAGNOSIS — O35HXX0 Maternal care for other (suspected) fetal abnormality and damage, fetal lower extremities anomalies, not applicable or unspecified: Secondary | ICD-10-CM | POA: Insufficient documentation

## 2021-04-25 DIAGNOSIS — O0990 Supervision of high risk pregnancy, unspecified, unspecified trimester: Secondary | ICD-10-CM

## 2021-04-25 NOTE — Procedures (Signed)
Dana Mcmillan ?12-30-1995 ?[redacted]w[redacted]d ? ?Fetus A Non-Stress Test Interpretation for 04/25/21 ? ?Indication:  club foot ? ?Fetal Heart Rate A ?Mode: External ?Baseline Rate (A): 130 bpm ?Variability: Moderate ?Accelerations: 15 x 15 ?Decelerations: None ?Multiple birth?: No ? ?Uterine Activity ?Mode: Toco ?Contraction Frequency (min): UI ?Resting Tone Palpated: Relaxed ? ?Interpretation (Fetal Testing) ?Nonstress Test Interpretation: Reactive ?Overall Impression: Reassuring for gestational age ?Comments: tracing reviewed by Dr. Judeth Cornfield ? ? ?

## 2021-04-27 ENCOUNTER — Ambulatory Visit (INDEPENDENT_AMBULATORY_CARE_PROVIDER_SITE_OTHER): Payer: BLUE CROSS/BLUE SHIELD | Admitting: Obstetrics and Gynecology

## 2021-04-27 ENCOUNTER — Other Ambulatory Visit: Payer: Self-pay | Admitting: Advanced Practice Midwife

## 2021-04-27 ENCOUNTER — Encounter: Payer: Self-pay | Admitting: *Deleted

## 2021-04-27 VITALS — BP 120/79 | HR 98 | Wt 141.0 lb

## 2021-04-27 DIAGNOSIS — Z3A37 37 weeks gestation of pregnancy: Secondary | ICD-10-CM

## 2021-04-27 DIAGNOSIS — R772 Abnormality of alphafetoprotein: Secondary | ICD-10-CM

## 2021-04-27 DIAGNOSIS — O0993 Supervision of high risk pregnancy, unspecified, third trimester: Secondary | ICD-10-CM

## 2021-04-27 NOTE — Progress Notes (Signed)
? ?  PRENATAL VISIT NOTE ? ?Subjective:  ?Dana Mcmillan is a 26 y.o. G1P0 at [redacted]w[redacted]d being seen today for ongoing prenatal care.  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 ? ?Patient reports no complaints.  Contractions: Not present. Vag. Bleeding: None.  Movement: Present. Denies leaking of fluid.  ? ?The following portions of the patient's history were reviewed and updated as appropriate: allergies, current medications, past family history, past medical history, past social history, past surgical history and problem list.  ? ?Objective:  ? ?Vitals:  ? 04/27/21 1616  ?BP: 120/79  ?Pulse: 98  ?Weight: 141 lb (64 kg)  ? ? ?Fetal Status: Fetal Heart Rate (bpm): 143   Movement: Present  Presentation: Vertex ? ?General:  Alert, oriented and cooperative. Patient is in no acute distress.  ?Skin: Skin is warm and dry. No rash noted.   ?Cardiovascular: Normal heart rate noted  ?Respiratory: Normal respiratory effort, no problems with respiration noted  ?Abdomen: Soft, gravid, appropriate for gestational age.  Pain/Pressure: Absent     ?Pelvic: Cervical exam deferred Dilation: 1 Effacement (%): 50 Station: -3  ?Extremities: Normal range of motion.  Edema: None  ?Mental Status: Normal mood and affect. Normal behavior. Normal judgment and thought content.  ? ?Assessment  and Plan:  ?Pregnancy: G1P0 at [redacted]w[redacted]d ?1. [redacted] weeks gestation of pregnancy ?GBS neg ? ?2. Abnormal alpha fetoprotein (AFP) level ?Pt for 37wk IOL on 4/7.  ?4/3: reactive NST ?3/29: 13%, 2493g, ac 34%, afi 15.7 ? ?Term labor symptoms and general obstetric precautions including but not limited to vaginal bleeding, contractions, leaking of fluid and fetal movement were reviewed in detail with the patient. ?Please refer to After Visit Summary for other counseling recommendations.  ? ?Return if symptoms worsen or fail to improve. ? ?Future Appointments  ?Date Time Provider [place]  ?04/29/2021  7:45 AM [facility] SCHED ROOM [facility] None  ?05/02/2021  7:30 AM [facility] NURSE [facility]  ?05/02/2021  7:45 AM [facility] US6 [facility]  ?05/04/2021  3:50 PM Aletha Halim, MD [facility]  ?05/11/2021  3:50 PM Aletha Halim, MD [facility]  ?05/18/2021  3:50 PM Donnamae Jude, MD [facility]  ? ? ?Aletha Halim, MD ? ?

## 2021-04-29 ENCOUNTER — Inpatient Hospital Stay (HOSPITAL_COMMUNITY): Payer: BLUE CROSS/BLUE SHIELD

## 2021-04-29 ENCOUNTER — Other Ambulatory Visit: Payer: Self-pay

## 2021-04-29 ENCOUNTER — Inpatient Hospital Stay (HOSPITAL_COMMUNITY): Payer: BLUE CROSS/BLUE SHIELD | Admitting: Anesthesiology

## 2021-04-29 ENCOUNTER — Inpatient Hospital Stay (HOSPITAL_COMMUNITY)
Admission: AD | Admit: 2021-04-29 | Discharge: 2021-05-01 | DRG: 807 | Disposition: A | Discharge status: Home / Self Care | Payer: BLUE CROSS/BLUE SHIELD | Attending: Obstetrics & Gynecology | Admitting: Obstetrics & Gynecology

## 2021-04-29 ENCOUNTER — Encounter (HOSPITAL_COMMUNITY): Payer: Self-pay | Admitting: Family Medicine

## 2021-04-29 DIAGNOSIS — Z3A37 37 weeks gestation of pregnancy: Secondary | ICD-10-CM | POA: Diagnosis not present

## 2021-04-29 DIAGNOSIS — O26893 Other specified pregnancy related conditions, third trimester: Principal | ICD-10-CM | POA: Diagnosis present

## 2021-04-29 DIAGNOSIS — O289 Unspecified abnormal findings on antenatal screening of mother: Secondary | ICD-10-CM | POA: Diagnosis not present

## 2021-04-29 DIAGNOSIS — O99891 Other specified diseases and conditions complicating pregnancy: Secondary | ICD-10-CM | POA: Diagnosis present

## 2021-04-29 DIAGNOSIS — Z8269 Family history of other diseases of the musculoskeletal system and connective tissue: Secondary | ICD-10-CM

## 2021-04-29 DIAGNOSIS — O358XX0 Maternal care for other (suspected) fetal abnormality and damage, not applicable or unspecified: Secondary | ICD-10-CM | POA: Diagnosis present

## 2021-04-29 DIAGNOSIS — O35HXX0 Maternal care for other (suspected) fetal abnormality and damage, fetal lower extremities anomalies, not applicable or unspecified: Secondary | ICD-10-CM | POA: Diagnosis present

## 2021-04-29 DIAGNOSIS — Z87891 Personal history of nicotine dependence: Secondary | ICD-10-CM | POA: Diagnosis not present

## 2021-04-29 DIAGNOSIS — Z3A38 38 weeks gestation of pregnancy: Secondary | ICD-10-CM

## 2021-04-29 DIAGNOSIS — Z141 Cystic fibrosis carrier: Secondary | ICD-10-CM

## 2021-04-29 DIAGNOSIS — R772 Abnormality of alphafetoprotein: Secondary | ICD-10-CM | POA: Diagnosis present

## 2021-04-29 DIAGNOSIS — O0990 Supervision of high risk pregnancy, unspecified, unspecified trimester: Secondary | ICD-10-CM

## 2021-04-29 DIAGNOSIS — R8271 Bacteriuria: Secondary | ICD-10-CM | POA: Diagnosis present

## 2021-04-29 LAB — CBC
HCT: 35.2 % — ABNORMAL LOW (ref 36.0–46.0)
Hemoglobin: 12 g/dL (ref 12.0–15.0)
MCH: 31.9 pg (ref 26.0–34.0)
MCHC: 34.1 g/dL (ref 30.0–36.0)
MCV: 93.6 fL (ref 80.0–100.0)
Platelets: 207 10*3/uL (ref 150–400)
RBC: 3.76 MIL/uL — ABNORMAL LOW (ref 3.87–5.11)
RDW: 13.7 % (ref 11.5–15.5)
WBC: 12.9 10*3/uL — ABNORMAL HIGH (ref 4.0–10.5)
nRBC: 0 % (ref 0.0–0.2)

## 2021-04-29 LAB — TYPE AND SCREEN
ABO/RH(D): O POS
Antibody Screen: NEGATIVE

## 2021-04-29 MED ORDER — SODIUM CHLORIDE 0.9 % IV SOLN
12.5000 mg | Freq: Once | INTRAVENOUS | Status: DC
  Filled 2021-04-29: qty 0.5

## 2021-04-29 MED ORDER — EPHEDRINE 5 MG/ML INJ
10.0000 mg | INTRAVENOUS | Status: DC | PRN
Start: 2021-04-29 — End: 2021-04-30
  Filled 2021-04-29: qty 5

## 2021-04-29 MED ORDER — OXYCODONE-ACETAMINOPHEN 5-325 MG PO TABS: 2.0000 | ORAL_TABLET | ORAL | Status: DC | PRN

## 2021-04-29 MED ORDER — FENTANYL-BUPIVACAINE-NACL 0.5-0.125-0.9 MG/250ML-% EP SOLN
12.0000 mL/h | EPIDURAL | Status: DC | PRN
  Administered 2021-04-29: 12 mL/h via EPIDURAL
  Filled 2021-04-29: qty 250

## 2021-04-29 MED ORDER — LIDOCAINE HCL (PF) 1 % IJ SOLN: 30.0000 mL | INTRAMUSCULAR | Status: DC | PRN

## 2021-04-29 MED ORDER — SOD CITRATE-CITRIC ACID 500-334 MG/5ML PO SOLN
30.0000 mL | ORAL | Status: DC | PRN
Start: 2021-04-29 — End: 2021-04-30

## 2021-04-29 MED ORDER — BUTORPHANOL TARTRATE 1 MG/ML IJ SOLN: 1.0000 mg | Freq: Once | INTRAMUSCULAR | Status: DC

## 2021-04-29 MED ORDER — MISOPROSTOL 25 MCG QUARTER TABLET: 25.0000 ug | ORAL_TABLET | ORAL | Status: DC | PRN

## 2021-04-29 MED ORDER — OXYTOCIN-SODIUM CHLORIDE 30-0.9 UT/500ML-% IV SOLN
1.0000 m[IU]/min | INTRAVENOUS | Status: DC
  Administered 2021-04-29 – 2021-04-30 (×2): 2 m[IU]/min via INTRAVENOUS

## 2021-04-29 MED ORDER — LACTATED RINGERS IV SOLN
INTRAVENOUS | Status: DC
Start: 2021-04-29 — End: 2021-04-30

## 2021-04-29 MED ORDER — LIDOCAINE HCL (PF) 1 % IJ SOLN
INTRAMUSCULAR | Status: DC | PRN
  Administered 2021-04-29: 11 mL via EPIDURAL

## 2021-04-29 MED ORDER — EPHEDRINE 5 MG/ML INJ
10.0000 mg | INTRAVENOUS | Status: DC | PRN
  Administered 2021-04-30: 10 mg via INTRAVENOUS

## 2021-04-29 MED ORDER — FLEET ENEMA 7-19 GM/118ML RE ENEM: 1.0000 | ENEMA | RECTAL | Status: DC | PRN

## 2021-04-29 MED ORDER — LACTATED RINGERS IV SOLN
500.0000 mL | INTRAVENOUS | Status: DC | PRN
  Administered 2021-04-30: 500 mL via INTRAVENOUS

## 2021-04-29 MED ORDER — OXYTOCIN-SODIUM CHLORIDE 30-0.9 UT/500ML-% IV SOLN
2.5000 [IU]/h | INTRAVENOUS | Status: DC
  Filled 2021-04-29: qty 500

## 2021-04-29 MED ORDER — OXYTOCIN BOLUS FROM INFUSION
333.0000 mL | Freq: Once | INTRAVENOUS | Status: AC
  Administered 2021-04-30: 333 mL via INTRAVENOUS

## 2021-04-29 MED ORDER — TERBUTALINE SULFATE 1 MG/ML IJ SOLN: 0.2500 mg | Freq: Once | INTRAMUSCULAR | Status: DC | PRN

## 2021-04-29 MED ORDER — LACTATED RINGERS IV SOLN: 500.0000 mL | Freq: Once | INTRAVENOUS | Status: DC

## 2021-04-29 MED ORDER — PHENYLEPHRINE 40 MCG/ML (10ML) SYRINGE FOR IV PUSH (FOR BLOOD PRESSURE SUPPORT)
80.0000 ug | PREFILLED_SYRINGE | INTRAVENOUS | Status: DC | PRN
Start: 2021-04-29 — End: 2021-04-30
  Administered 2021-04-30 (×2): 80 ug via INTRAVENOUS

## 2021-04-29 MED ORDER — ACETAMINOPHEN 325 MG PO TABS: 650.0000 mg | ORAL_TABLET | ORAL | Status: DC | PRN

## 2021-04-29 MED ORDER — PHENYLEPHRINE 40 MCG/ML (10ML) SYRINGE FOR IV PUSH (FOR BLOOD PRESSURE SUPPORT)
80.0000 ug | PREFILLED_SYRINGE | INTRAVENOUS | Status: DC | PRN
  Filled 2021-04-29: qty 10

## 2021-04-29 MED ORDER — DIPHENHYDRAMINE HCL 50 MG/ML IJ SOLN
12.5000 mg | INTRAMUSCULAR | Status: DC | PRN
  Administered 2021-04-29 (×2): 12.5 mg via INTRAVENOUS
  Filled 2021-04-29 (×2): qty 1

## 2021-04-29 MED ORDER — ONDANSETRON HCL 4 MG/2ML IJ SOLN
4.0000 mg | Freq: Four times a day (QID) | INTRAMUSCULAR | Status: DC | PRN
  Administered 2021-04-29 – 2021-04-30 (×2): 4 mg via INTRAVENOUS
  Filled 2021-04-29 (×2): qty 2

## 2021-04-29 MED ORDER — OXYCODONE-ACETAMINOPHEN 5-325 MG PO TABS: 1.0000 | ORAL_TABLET | ORAL | Status: DC | PRN

## 2021-04-29 NOTE — Progress Notes (Signed)
Dana Mcmillan is a 26 y.o. G1P0 at [redacted]w[redacted]d by admitted for induction of labor due to elevated AFP. ? ?Subjective: ?Feeling comfortable with epidural. No specific concerns, not feeling much pressure with contractions.  ? ?Objective: ?BP (!) 92/47   Pulse 64   Temp 97.7 ?F (36.5 ?C) (Oral)   Resp 18   Ht 5\' 6"  (1.676 m)   Wt 65 kg   LMP 08/07/2020   BMI 23.11 kg/m?  ?No intake/output data recorded. ?No intake/output data recorded. ? ?FHT:  FHR: 145 bpm, variability: moderate,  accelerations:  Present,  decelerations:  Present Variables ?UC:   regular, every 2-3 minutes ?SVE:   Dilation: 7 ?Effacement (%): 90 ?Station: 0 ?Exam by:: [email or web address] RN ? ?Labs: ?Lab Results  ?Component Value Date  ? WBC 12.9 (H) 04/29/2021  ? HGB 12.0 04/29/2021  ? HCT 35.2 (L) 04/29/2021  ? MCV 93.6 04/29/2021  ? PLT 207 04/29/2021  ? ? ?Assessment / Plan: ?G1P0 at [redacted]w[redacted]d by admitted for induction of labor due to elevated AFP. ? ?Labor:  On pitocin and s/p AROM at 1920. Patient comfortable with epidural. Patient began having variable decelerations with some improvement after position changes. IUPC placed at this check. If recurrent variables consider amnioinfusion. Patient progressing quickly over recent checks.  ?Fetal Wellbeing:  Category II ?Pain Control:  Epidural ?I/D:   GBS neg ? ?[redacted]w[redacted]d ?04/29/2021, 11:38 PM ? ? ?

## 2021-04-29 NOTE — Progress Notes (Signed)
Dana Mcmillan is a 26 y.o. G1P0 at [redacted]w[redacted]d admitted for IOL d/t increased AFP.  ? ?Subjective: ?Dana reports feeling more pain and nausea. She is requesting something other than zofran for nausea because it has not helped.  ? ?Objective: ?BP 130/86   Pulse 67   Temp 97.9 ?F (36.6 ?C) (Oral)   Resp 18   Ht 5\' 6"  (1.676 m)   Wt 65 kg   LMP 08/07/2020   BMI 23.11 kg/m?  ?No intake/output data recorded. ?No intake/output data recorded. ? ?FHT: 115 bpm, moderate variability, +15x15 accels, no decels ?UC:  Q 08/09/2020 ?SVE:  4/70/-2, vtx, 02[birth year]-1987, CNM ? ?Labs: ?Lab Results  ?Component Value Date  ? WBC 12.9 (H) 04/29/2021  ? HGB 12.0 04/29/2021  ? HCT 35.2 (L) 04/29/2021  ? MCV 93.6 04/29/2021  ? PLT 207 04/29/2021  ? ? ?Assessment / Plan: ?06/29/2021 N. Angelucci is a 26 y.o. G1P0 at [redacted]w[redacted]d admitted for IOL d/t increased AFP ? ?Labor: Progressing on Pitocin. Continue titration prn per unit policy. AROM when appropriate ?Fetal Wellbeing:  Category I ?Pain Control:  Epidural when desired ?I/D:  GBS negative ?Anticipated MOD:  NSVD ? ? ?[redacted]w[redacted]d, CNM ?04/29/2021, 5:22 PM ? ? ?

## 2021-04-29 NOTE — H&P (Addendum)
OBSTETRIC ADMISSION HISTORY AND PHYSICAL ? ?Dana Mcmillan is a 26 y.o. female G1P0 with IUP at 20w6dby UKore[place]ing for IOL for elevated AFP . She reports +FMs, No LOF, no VB, no blurry vision, headaches or peripheral edema, and RUQ pain.  She plans on breast feeding. She is undecided about birth control. ?[facility] ?Dating: By [place]--->  Estimated Date of Delivery: 05/14/21 ? ?Sono:   ? ?@[redacted]w[redacted]d , CWD, normal anatomy, cephalic presentation,  24196Q 13% EFW ? ? ?Prenatal History/Complications:  ?-Bilateral fetal choroid plexus cyst (resolved) ?-Family history of congenital anomaly with clubfoot  ?-LR NIPS ? ?Past Medical History: ?Past Medical History:  ?Diagnosis Date  ? Anxiety   ? MDD (major depressive disorder)   ? Panic disorder with agoraphobia and moderate panic attacks   ? ? ?Past Surgical History: ?Past Surgical History:  ?Procedure Laterality Date  ? ANKLE SURGERY Right 09/2019  ? WISDOM TOOTH EXTRACTION  2017  ? ? ?Obstetrical History: ?OB History   ? ? Gravida  ?1  ? Para  ?   ? Term  ?   ? Preterm  ?   ? AB  ?   ? Living  ?   ?  ? ? SAB  ?   ? IAB  ?   ? Ectopic  ?   ? Multiple  ?   ? Live Births  ?   ?   ?  ?  ? ? ?Social History ?Social History  ? ?Socioeconomic History  ? Marital status: Single  ?  Spouse name: Not on file  ? Number of children: Not on file  ? Years of education: Not on file  ? Highest education level: Not on file  ?Occupational History  ? Not on file  ?Tobacco Use  ? Smoking status: Former  ?  Types: Cigarettes  ?  Quit date: 01/04/2019  ?  Years since quitting: 2.3  ?  Passive exposure: Never  ? Smokeless tobacco: Never  ?Vaping Use  ? Vaping Use: Every day  ? Substances: Nicotine, CBD, Flavoring  ?Substance and Sexual Activity  ? Alcohol use: Not Currently  ?  Comment: not since confirmed pregnancy  ? Drug use: Not Currently  ?  Comment: not since confirmed pregnancy  ? Sexual activity: Yes  ?  Partners: Male  ?  Birth control/protection:  None  ?Other Topics Concern  ? Not on file  ?Social History Narrative  ? Not on file  ? ?Social Determinants of Health  ? ?Financial Resource Strain: Not on file  ?Food Insecurity: Not on file  ?Transportation Needs: Not on file  ?Physical Activity: Not on file  ?Stress: Not on file  ?Social Connections: Not on file  ? ? ?Family History: ?Family History  ?Problem Relation Age of Onset  ? Prostate cancer Paternal Grandfather   ? ? ?Allergies: ?Allergies  ?Allergen Reactions  ? Latex   ? Other Rash  ? ? ?Pt denies allergies to latex, iodine, or shellfish. ? ?Medications Prior to Admission  ?Medication Sig Dispense Refill Last Dose  ? Blood Pressure KIT 1 Device by Does not apply route once a week. To be monitored from home (Patient not taking: Reported on 12/28/2020) 1 kit 0   ? ondansetron (ZOFRAN-ODT) 4 MG disintegrating tablet TAKE 1-2 TABLETS (4-8 MG TOTAL) BY MOUTH EVERY 6 (SIX) HOURS AS NEEDED FOR NAUSEA OR VOMITING. 20 tablet 3   ? Prenatal MV & Min  w/FA-DHA (PRENATAL GUMMIES PO) Take by mouth.     ? valACYclovir (VALTREX) 1000 MG tablet Take 1 tablet (1,000 mg total) by mouth 2 (two) times daily. (Patient not taking: Reported on 04/06/2021) 10 tablet 2   ? ? ? ?Review of Systems  ? ?All systems reviewed and negative except as stated in HPI ? ?Last menstrual period 08/07/2020. ?General appearance: alert, cooperative, and appears stated age ?Lungs: clear to auscultation bilaterally ?Heart: regular rate and rhythm ?Abdomen: soft, non-tender; bowel sounds normal ?Extremities: Homans sign is negative, no sign of DVT ?Presentation: cephalic ?Fetal monitoring: Baseline: 130 bpm, Variability: Good {> 6 bpm), and Accelerations: Reactive ?Uterine activity. Intermittent contractions ?  ? ? ?Prenatal labs: ?ABO, Rh: --/--/O POS (03/01 1935) ?Antibody: NEG (03/01 1935) ?Rubella: 2.04 (10/10 1459) ?RPR: Non Reactive (01/31 0857)  ?HBsAg: Negative (10/10 1459)  ?HIV: Non Reactive (01/31 0857)  ?GBS: Negative/-- (03/29 1628)   ?1 hr Glucola: Normal ?Genetic screening:  LR NIPS ?Anatomy US: Bilateral CPC ? ?Prenatal Transfer Tool  ?Maternal Diabetes: No ?Genetic Screening: Abnormal:  Results: Other:elevated AFP, CF carrier  ?Maternal Ultrasounds/Referrals: Isolated choroid plexus cyst ?Fetal Ultrasounds or other Referrals:  None ?Maternal Substance Abuse:  No ?Significant Maternal Medications:  None ?Significant Maternal Lab Results: Group B Strep negative ? ?Results for orders placed or performed during the hospital encounter of 04/29/21 (from the past 24 hour(s))  ?Type and screen  ? Collection Time: 04/29/21 11:45 AM  ?Result Value Ref Range  ? ABO/RH(D) PENDING   ? Antibody Screen PENDING   ? Sample Expiration    ?  05/02/2021,2359 ?Performed at [facility], [place] [address], [place], [place] [address] ?  ? ? ?Patient Active Problem List  ? Diagnosis Date Noted  ? Vaginal bleeding in pregnancy, third trimester 03/23/2021  ? Fetal growth problem suspected but not found 03/10/2021  ? 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 Abnormal alpha fetoprotein (AFP) level 12/01/2020  ? Asymptomatic bacteriuria during pregnancy in first trimester 11/05/2020  ? Family history of clubfoot in FOB 11/03/2020  ? Supervision of high risk pregnancy, antepartum 10/05/2020  ? Displaced bimalleolar fracture of right lower leg, initial encounter for closed fracture 10/14/2019  ? Mild scoliosis 12/09/2014  ? 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 MDD (major depressive disorder), single episode, moderate ([place]) 12/10/2012  ? ? ?Assessment/Plan:  ?Dana Mcmillan is a 26 y.o. G1P0 at 67w6dhere for IOL due to elevated AFP  ? ?#Labor:Progressing well. 3cm dilated on cervical check. Will allow patient to have light diet and plan to start pitocin. Recheck in 3-4 hours. ?#Pain: IV fentanyl, epidural if desired  ?#FWB: Cat 1 ?#ID: GBS negative ?#MOF: Breast  feeding  ?#MOC: Undecided ?#Circ:  Yes  ? ?JAlen Bleacher MD  ?[facility]?04/29/2021, 10:24 AM ? ? ?  ?Attestation of CNM Supervision of Resident: Evaluation and management procedures were [facility] under my supervision. I was immediately available for direct supervision, assistance and direction throughout this encounter.  I also confirm that I have verified the information documented in the resident?s note, and that I have also personally reperformed the pertinent components of the physical exam and all of the medical decision making activities.  I have also made any necessary editorial changes. ? ?DRenee Harder CNM ?04/29/2021 12:18 PM ? ? ?

## 2021-04-29 NOTE — Progress Notes (Signed)
[place] N Gottsch is a 26 y.o. G1P0 at [redacted]w[redacted]d by admitted for induction of labor due to elevated AFP. ? ?Subjective: ?Reports feels well. Not feeling contractions with epidural ? ?Objective: ?BP (!) 101/57   Pulse 62   Temp 97.7 ?F (36.5 ?C) (Oral)   Resp 17   Ht 5\' 6"  (1.676 m)   Wt 65 kg   LMP 08/07/2020   BMI 23.11 kg/m?  ?No intake/output data recorded. ?No intake/output data recorded. ? ?FHT:  FHR: 120 bpm, variability: moderate,  accelerations:  Present,  decelerations:  Absent ?UC:   regular, every 1-2 minutes ?SVE:   Dilation: 4.5 ?Effacement (%): 70 ?Station: -2 ?Exam by:: [email or web address], RN ? ?Labs: ?Lab Results  ?Component Value Date  ? WBC 12.9 (H) 04/29/2021  ? HGB 12.0 04/29/2021  ? HCT 35.2 (L) 04/29/2021  ? MCV 93.6 04/29/2021  ? PLT 207 04/29/2021  ? ? ?Assessment / Plan: ?G1P0 at [redacted]w[redacted]d by admitted for induction of labor due to elevated AFP. ? ?Labor:  On pitocin and s/p AROM at 1920. Patient comfortable with epidural. Plan for check at ~2320 and consider IUPC at that time if warranted.  ?Fetal Wellbeing:  Category I ?Pain Control:  Epidural ?I/D:   GBS neg ? ?[redacted]w[redacted]d ?04/29/2021, 9:33 PM ? ? ?

## 2021-04-29 NOTE — Anesthesia Procedure Notes (Signed)
Epidural ?Patient location during procedure: OB ?Start time: 04/29/2021 6:12 PM ?End time: 04/29/2021 6:25 PM ? ?Staffing ?Anesthesiologist: Lowella Curb, MD ?Performed: anesthesiologist  ? ?Preanesthetic Checklist ?Completed: patient identified, IV checked, site marked, risks and benefits discussed, surgical consent, monitors and equipment checked, pre-op evaluation and timeout performed ? ?Epidural ?Patient position: sitting ?Prep: ChloraPrep ?Patient monitoring: heart rate, cardiac monitor, continuous pulse ox and blood pressure ?Approach: midline ?Location: L2-L3 ?Injection technique: LOR saline ? ?Needle:  ?Needle type: Tuohy  ?Needle gauge: 17 G ?Needle length: 9 cm ?Catheter type: closed end flexible ?Catheter size: 20 Guage ?Catheter at skin depth: 8 cm ?Test dose: negative ? ?Assessment ?Events: blood not aspirated, injection not painful, no injection resistance, no paresthesia and negative IV test ? ?Additional Notes ?Reason for block:procedure for pain ? ? ? ?

## 2021-04-29 NOTE — Progress Notes (Signed)
Dana Mcmillan is a 26 y.o. G1P0 at [redacted]w[redacted]d admitted for IOL d/t increased AFP.  ? ?Subjective: ?Dana is comfortable with epidural.  ? ?Objective: ?BP 107/67   Pulse 61   Temp 97.7 ?F (36.5 ?C) (Oral)   Resp 18   Ht 5\' 6"  (1.676 m)   Wt 65 kg   LMP 08/07/2020   BMI 23.11 kg/m?  ?No intake/output data recorded. ?No intake/output data recorded. ? ?FHT: 115 bpm, moderate variability, +15x15 accels, no decels ?UC:  Q 08/09/2020 ?SVE:  4.5/70/-2, vtx, 09-23-1995, CNM ? ?Labs: ?Lab Results  ?Component Value Date  ? WBC 12.9 (H) 04/29/2021  ? HGB 12.0 04/29/2021  ? HCT 35.2 (L) 04/29/2021  ? MCV 93.6 04/29/2021  ? PLT 207 04/29/2021  ? ? ?Assessment / Plan: ?06/29/2021 Dana Mcmillan is a 26 y.o. G1P0 at [redacted]w[redacted]d admitted for IOL d/t increased AFP ? ?Labor: Progressing on Pitocin. Pitocin was on 14mu/min but turned back to 18mu/min at 1800 d/t tachysystole. R/B/A of AROM discussed. Patient gives verbal consent. Patient and fetus tolerated procedure well. Continue titration prn per unit policy.  ?Fetal Wellbeing:  Category I ?Pain Control:  Epidural when desired ?I/D:  GBS negative ?Anticipated MOD:  NSVD ? ? ? ?11m, CNM ?04/29/2021, 7:47 PM ? ? ?

## 2021-04-29 NOTE — Anesthesia Preprocedure Evaluation (Signed)
Anesthesia Evaluation  ?Patient identified by MRN, date of birth, ID band ?Patient awake ? ? ? ?Reviewed: ?Allergy & Precautions, NPO status , Patient's Chart, lab work & pertinent test results ? ?Airway ?Mallampati: II ? ?TM Distance: >3 FB ?Neck ROM: Full ? ? ? Dental ?no notable dental hx. ? ?  ?Pulmonary ?neg pulmonary ROS, former smoker,  ?  ?Pulmonary exam normal ?breath sounds clear to auscultation ? ? ? ? ? ? Cardiovascular ?negative cardio ROS ?Normal cardiovascular exam ?Rhythm:Regular Rate:Normal ? ? ?  ?Neuro/Psych ?Anxiety Depression negative neurological ROS ? negative psych ROS  ? GI/Hepatic ?negative GI ROS, Neg liver ROS,   ?Endo/Other  ?negative endocrine ROS ? Renal/GU ?negative Renal ROS  ?negative genitourinary ?  ?Musculoskeletal ?negative musculoskeletal ROS ?(+)  ? Abdominal ?  ?Peds ?negative pediatric ROS ?(+)  Hematology ?negative hematology ROS ?(+)   ?Anesthesia Other Findings ? ? Reproductive/Obstetrics ?(+) Pregnancy ? ?  ? ? ? ? ? ? ? ? ? ? ? ? ? ?  ?  ? ? ? ? ? ? ? ? ?Anesthesia Physical ?Anesthesia Plan ? ?ASA: 2 ? ?Anesthesia Plan: Epidural  ? ?Post-op Pain Management:   ? ?Induction:  ? ?PONV Risk Score and Plan: Treatment may vary due to age or medical condition ? ?Airway Management Planned:  ? ?Additional Equipment:  ? ?Intra-op Plan:  ? ?Post-operative Plan:  ? ?Informed Consent: I have reviewed the patients History and Physical, chart, labs and discussed the procedure including the risks, benefits and alternatives for the proposed anesthesia with the patient or authorized representative who has indicated his/her understanding and acceptance.  ? ? ? ? ? ?Plan Discussed with:  ? ?Anesthesia Plan Comments:   ? ? ? ? ? ? ?Anesthesia Quick Evaluation ? ?

## 2021-04-30 ENCOUNTER — Encounter (HOSPITAL_COMMUNITY): Payer: Self-pay | Admitting: Family Medicine

## 2021-04-30 DIAGNOSIS — Z3A37 37 weeks gestation of pregnancy: Secondary | ICD-10-CM

## 2021-04-30 DIAGNOSIS — O289 Unspecified abnormal findings on antenatal screening of mother: Secondary | ICD-10-CM

## 2021-04-30 LAB — RPR: RPR Ser Ql: NONREACTIVE

## 2021-04-30 MED ORDER — BENZOCAINE-MENTHOL 20-0.5 % EX AERO: 1.0000 "application " | INHALATION_SPRAY | CUTANEOUS | Status: DC | PRN

## 2021-04-30 MED ORDER — ONDANSETRON HCL 4 MG PO TABS: 4.0000 mg | ORAL_TABLET | ORAL | Status: DC | PRN

## 2021-04-30 MED ORDER — PRENATAL MULTIVITAMIN CH
1.0000 | ORAL_TABLET | Freq: Every day | ORAL | Status: DC
  Administered 2021-04-30: 1 via ORAL
  Filled 2021-04-30: qty 1

## 2021-04-30 MED ORDER — COCONUT OIL OIL: 1.0000 "application " | TOPICAL_OIL | Status: DC | PRN

## 2021-04-30 MED ORDER — SENNOSIDES-DOCUSATE SODIUM 8.6-50 MG PO TABS: 2.0000 | ORAL_TABLET | Freq: Every day | ORAL | Status: DC

## 2021-04-30 MED ORDER — ACETAMINOPHEN 325 MG PO TABS
650.0000 mg | ORAL_TABLET | ORAL | Status: DC | PRN
  Administered 2021-04-30 – 2021-05-01 (×4): 650 mg via ORAL
  Filled 2021-04-30 (×4): qty 2

## 2021-04-30 MED ORDER — DIBUCAINE (PERIANAL) 1 % EX OINT: 1.0000 "application " | TOPICAL_OINTMENT | CUTANEOUS | Status: DC | PRN

## 2021-04-30 MED ORDER — TETANUS-DIPHTH-ACELL PERTUSSIS 5-2.5-18.5 LF-MCG/0.5 IM SUSY: 0.5000 mL | PREFILLED_SYRINGE | Freq: Once | INTRAMUSCULAR | Status: DC

## 2021-04-30 MED ORDER — IBUPROFEN 600 MG PO TABS
600.0000 mg | ORAL_TABLET | Freq: Four times a day (QID) | ORAL | Status: DC
  Administered 2021-04-30 – 2021-05-01 (×4): 600 mg via ORAL
  Filled 2021-04-30 (×4): qty 1

## 2021-04-30 MED ORDER — DIPHENHYDRAMINE HCL 25 MG PO CAPS: 25.0000 mg | ORAL_CAPSULE | Freq: Four times a day (QID) | ORAL | Status: DC | PRN

## 2021-04-30 MED ORDER — SIMETHICONE 80 MG PO CHEW: 80.0000 mg | CHEWABLE_TABLET | ORAL | Status: DC | PRN

## 2021-04-30 MED ORDER — WITCH HAZEL-GLYCERIN EX PADS: 1.0000 "application " | MEDICATED_PAD | CUTANEOUS | Status: DC | PRN

## 2021-04-30 MED ORDER — ONDANSETRON HCL 4 MG/2ML IJ SOLN: 4.0000 mg | INTRAMUSCULAR | Status: DC | PRN

## 2021-04-30 NOTE — Lactation Note (Signed)
This note was copied from a baby's chart. ?Lactation Consultation Note ?Checking on mom and baby. Mom stated baby is still sleepy. FOB had used hand pump on mom collecting a little colostrum and they are spoon feeding baby. Mom holding baby STS. Encouraged mom to call for [facility] assistance tonight. Praised mom for giving colostrum. ? ? ?Patient Name: Dana Mcmillan ?Today's Date: 04/30/2021 ?  ?Age:24 hours ? ?Maternal Data ?  ? ?Feeding ?  ? ?LATCH Score ?  ? ?  ? ?  ? ?  ? ?  ? ?  ? ? ?Lactation Tools Discussed/Used ?  ? ?Interventions ?  ? ?Discharge ?  ? ?Consult Status ?  ? ? ? ?Charyl Dancer ?04/30/2021, 8:24 PM ? ? ? ?

## 2021-04-30 NOTE — Lactation Note (Signed)
This note was copied from a baby's chart. ?Lactation Consultation Note ? ?Patient Name: Dana Mcmillan ?Today's Date: 04/30/2021 ?Reason for consult: Initial assessment ?Age:24 hours ? ?P1,  Offered to assist with hand expression and latching. ?Mother will call if she would like assistance. ?Feed on demand with cues.  Goal 8-12+ times per day after first 24 hrs.  Place baby STS if not cueing.  ?Lactation information sheet given.  ? ?Maternal Data ?Has patient been taught Hand Expression?: No ?Does the patient have breastfeeding experience prior to this delivery?: No ? ?Feeding ?Mother's Current Feeding Choice: Breast Milk ? ?LATCH Score ?Latch: Repeated attempts needed to sustain latch, nipple held in mouth throughout feeding, stimulation needed to elicit sucking reflex. ? ?Audible Swallowing: A few with stimulation ? ?Type of Nipple: Everted at rest and after stimulation ? ?Comfort (Breast/Nipple): Soft / non-tender ? ?Hold (Positioning): Assistance needed to correctly position infant at breast and maintain latch. ? ?LATCH Score: 7 ? ? ?Lactation Tools Discussed/Used ?  ? ?Interventions ?Interventions: LC Services brochure ? ?Discharge ?  ? ?Consult Status ?Consult Status: PRN ? ? ? ?Dahlia Byes Boschen ?04/30/2021, 8:20 AM ? ? ? ?

## 2021-04-30 NOTE — Lactation Note (Signed)
This note was copied from a baby's chart. ?Lactation Consultation Note ? ?Patient Name: Dana Mcmillan ?Today's Date: 04/30/2021 ?Reason for consult: Follow-up assessment;1st time breastfeeding;Primapara;Early term 37-38.6wks ?Age:24 hours ? ?LC in to visit with P1 Mom of 38 wk baby weighing 6 lbs 6 oz.  Mom states she has been keeping baby STS and watching for feeding cues.  Mom states baby has latched and sucked a little after he was born.  Baby has been sleepy and not showing any cues. ? ?Mom would like to be shown hand expression.  LC demonstrated this and Mom return demo'd and colostrum drops expressed easily. Colostrum drops on nipple and baby opened his mouth onto breast. ? ?Baby placed STS on Mom's chest in laid back hold to encouraged baby to open his mouth.  Baby sleepy.  Lots of basic teaching done.   ? ?Baby latched with guidance and sat there with a few sucks.   ?LC provided breast shells and a hand pump to pre-pump if needed. ? ?FOB changed a diaper with a void and stool (large). ? ?Baby awakened and placed back on the 2nd breast.  Baby opened his mouth wider and latched while sandwiching breast.  Baby sucking a little, no swallows, but baby relaxed and responding well to Mom's encouraging voice.  Mom c/o of uterine cramping during the 2nd latch. ? ?Talked about keeping baby STS on the breast as much as possible. ? ?Visitors are due in.  Recommended holding off on everyone holding baby, to not stress him out.   ? ?Mom encouraged to ask for pump set up with baby is 20-24 hrs old and still hasn't had a good consistent feeding on the breast.  Mom aware of of donor milk availability if needed.  Reassured Mom that sleepiness is normal in the first 20 hrs of life. ? ?Mom to call for assistance prn. ? ?Maternal Data ?Has patient been taught Hand Expression?: Yes ?Does the patient have breastfeeding experience prior to this delivery?: No ? ?Feeding ?Mother's Current Feeding Choice: Breast Milk ? ?LATCH  Score ?Latch: Repeated attempts needed to sustain latch, nipple held in mouth throughout feeding, stimulation needed to elicit sucking reflex. ? ?Audible Swallowing: None ? ?Type of Nipple: Everted at rest and after stimulation (short nipples and compressible areola) ? ?Comfort (Breast/Nipple): Soft / non-tender ? ?Hold (Positioning): Assistance needed to correctly position infant at breast and maintain latch. ? ?LATCH Score: 6 ? ? ?Lactation Tools Discussed/Used ?Tools: Shells;Pump ?Breast pump type: Manual ?Reason for Pumping: pre-pump ? ?Interventions ?Interventions: Breast feeding basics reviewed;Assisted with latch;Skin to skin;Breast massage;Hand express;Pre-pump if needed;Breast compression;Adjust position;Support pillows;Position options;Shells;Hand pump ? ?Discharge ?  ? ?Consult Status ?Consult Status: Follow-up ?Date: 05/01/21 ?Follow-up type: In-patient ? ? ? ?Johny Blamer E ?04/30/2021, 4:44 PM ? ? ? ?

## 2021-04-30 NOTE — Progress Notes (Signed)
L&D reported that patient did not void but was bladder scanned for 126 ml. ?

## 2021-04-30 NOTE — Anesthesia Postprocedure Evaluation (Signed)
Anesthesia Post Note ? ?Patient: Dana Mcmillan ? ?Procedure(s) Performed: AN AD HOC LABOR EPIDURAL ? ?  ? ?Patient location during evaluation: Mother Baby ?Anesthesia Type: Epidural ?Level of consciousness: awake ?Pain management: satisfactory to patient ?Vital Signs Assessment: post-procedure vital signs reviewed and stable ?Respiratory status: spontaneous breathing ?Cardiovascular status: stable ?Anesthetic complications: no ? ? ?No notable events documented. ? ?Last Vitals:  ?Vitals:  ? 04/30/21 0730 04/30/21 1109  ?BP: 114/60 112/70  ?Pulse: 64 68  ?Resp: 17 15  ?Temp: 36.4 ?C 36.4 ?C  ?SpO2: 100% 98%  ?  ?Last Pain:  ?Vitals:  ? 04/30/21 1109  ?TempSrc: Oral  ?PainSc: 0-No pain  ? ?Pain Goal:   ? ?  ?  ?  ?  ?  ?  ?  ? ?Milady Fleener ? ? ? ? ?

## 2021-04-30 NOTE — Plan of Care (Signed)
?  Problem: Education: ?Goal: Knowledge of General Education information will improve ?Description: Including pain rating scale, medication(s)/side effects and non-pharmacologic comfort measures ?Outcome: Completed/Met ?  ?

## 2021-04-30 NOTE — Discharge Summary (Signed)
? ?  Postpartum Discharge Summary ? ?   ?Patient Name: Dana Mcmillan ?DOB: December 05, 1995 ?MRN: [id number] ? ?Date of admission: 04/29/2021 ?Delivery date:04/30/2021  ?Delivering provider: Eulas Post, ADAM J  ?Date of discharge: 05/01/2021 ? ?Admitting diagnosis: Abnormal alpha fetoprotein (AFP) level [R77.2] ?Intrauterine pregnancy: [redacted]w[redacted]d    ?Secondary diagnosis:  Principal Problem: ?  Abnormal alpha fetoprotein (AFP) level ?Active Problems: ?  Supervision of high risk pregnancy, antepartum ?  Family history of clubfoot in FOB ?  Asymptomatic bacteriuria during pregnancy in first trimester ?  Club foot of fetus affecting antepartum care of mother ? ?Additional problems: None   ?Discharge diagnosis: Term Pregnancy Delivered                                              ?Post partum procedures:   ?Augmentation: AROM and Pitocin ?Complications: None ? ?Hospital course: Induction of Labor With Vaginal Delivery   ?26y.o. yo G1P0 at 364w0das admitted to the hospital 04/29/2021 for induction of labor.  Indication for induction:  elevated AFP .  Patient had an uncomplicated labor course as follows: ?Membrane Rupture Time/Date: 7:19 PM ,04/29/2021   ?Delivery Method:Vaginal, Spontaneous  ?Episiotomy: None  ?Lacerations:  Periurethral  ?Details of delivery can be found in separate delivery note.  Patient had a routine postpartum course. Patient is discharged home 05/01/21. ? ?Newborn Data: ?Birth date:04/30/2021  ?Birth time:4:15 AM  ?Gender:Female  ?Living status:Living  ?Apgars:9 ,9  ?Weight:2892 g  ? ?Magnesium Sulfate received: No ?BMZ received: No ?Rhophylac:N/A ?MMR:N/A ?T-DaP:Given prenatally ? ? ?Physical exam  ?Vitals:  ? 04/30/21 1109 04/30/21 1658 04/30/21 2115 05/01/21 0649  ?BP: 112/70 117/83 122/81 133/89  ?Pulse: 68 (!) 52 64 (!) 57  ?Resp: 15 17 18 18   ?Temp: 97.6 ?F (36.4 ?C) 98.3 ?F (36.8 ?C) 98.1 ?F (36.7 ?C) 97.7 ?F (36.5 ?C)  ?TempSrc: Oral Oral Oral Oral  ?SpO2: 98% 98%    ?Weight:      ?Height:      ? ?General: alert,  cooperative, and no distress ?Lochia: appropriate ?Uterine Fundus: firm ?Incision: N/A ?DVT Evaluation: No evidence of DVT seen on physical exam. ?Negative Homan's sign. ?No cords or calf tenderness. ?Labs: ?Lab Results  ?Component Value Date  ? WBC 12.9 (H) 04/29/2021  ? HGB 12.0 04/29/2021  ? HCT 35.2 (L) 04/29/2021  ? MCV 93.6 04/29/2021  ? PLT 207 04/29/2021  ? ? ?  Latest Ref Rng & Units 11/09/2020  ?  8:15 PM  ?CMP  ?Glucose 70 - 99 mg/dL 117    ?BUN 6 - 20 mg/dL 8    ?Creatinine 0.44 - 1.00 mg/dL 0.71    ?Sodium 135 - 145 mmol/L 135    ?Potassium 3.5 - 5.1 mmol/L 3.1    ?Chloride 98 - 111 mmol/L 100    ?CO2 22 - 32 mmol/L 19    ?Calcium 8.9 - 10.3 mg/dL 10.0    ?Total Protein 6.5 - 8.1 g/dL 8.1    ?Total Bilirubin 0.3 - 1.2 mg/dL 1.0    ?Alkaline Phos 38 - 126 U/L 55    ?AST 15 - 41 U/L 25    ?ALT 0 - 44 U/L 15    ? ?Edinburgh Score: ? ?  04/30/2021  ? 11:09 AM  ?EdFlavia Shipperostnatal Depression Scale Screening Tool  ?I have been able to laugh and see  the funny side of things. 0  ?I have looked forward with enjoyment to things. 0  ?I have blamed myself unnecessarily when things went wrong. 0  ?I have been anxious or worried for no good reason. 0  ?I have felt scared or panicky for no good reason. 0  ?Things have been getting on top of me. 0  ?I have been so unhappy that I have had difficulty sleeping. 0  ?I have felt sad or miserable. 0  ?I have been so unhappy that I have been crying. 0  ?The thought of harming myself has occurred to me. 0  ?Edinburgh Postnatal Depression Scale Total 0  ? ? ? ?After visit meds:  ?Allergies as of 05/01/2021   ? ?   Reactions  ? Latex Rash  ? ?  ? ?  ?Medication List  ?  ? ?STOP taking these medications   ? ?ondansetron 4 MG disintegrating tablet ?Commonly known as: ZOFRAN-ODT ?  ?valACYclovir 1000 MG tablet ?Commonly known as: VALTREX ?  ? ?  ? ?TAKE these medications   ? ?Blood Pressure Kit ?1 Device by Does not apply route once a week. To be monitored from home ?  ?ibuprofen 600  MG tablet ?Commonly known as: ADVIL ?Take 1 tablet (600 mg total) by mouth every 6 (six) hours as needed for mild pain, moderate pain or cramping. ?  ?PRENATAL GUMMIES PO ?Take 1 tablet by mouth daily. ?  ? ?  ? ? ? ?Discharge home in stable condition ?Infant Feeding: Breast ?Infant Disposition:home with mother ?Discharge instruction: per After Visit Summary and Postpartum booklet. ?Activity: Advance as tolerated. Pelvic rest for 6 weeks.  ?Diet: routine diet ?Future Appointments: ?Future Appointments  ?Date Time Provider [place]  ?05/02/2021  7:30 AM [facility] NURSE [facility]  ?05/02/2021  7:45 AM [facility] US6 [facility]  ?05/04/2021  3:50 PM Aletha Halim, MD [facility]  ?05/11/2021  3:50 PM Aletha Halim, MD [facility]  ?05/18/2021  3:50 PM Donnamae Jude, MD [facility]  ? ?Follow up Visit: ?Message sent to [facility] by Dr. Cy Blamer on 4/8 ? ?Please schedule this patient for a In person postpartum visit in 4 weeks with the following provider: MD. ?Additional Postpartum F/U: BP check next week   ?High risk pregnancy complicated by:  elevated AFP ?Delivery mode:  Vaginal, Spontaneous  ?Anticipated Birth Control:   declined ? ? ?05/01/2021 ?Verita Schneiders, MD ? ? ? ?

## 2021-05-01 MED ORDER — IBUPROFEN 600 MG PO TABS
600.0000 mg | ORAL_TABLET | Freq: Four times a day (QID) | ORAL | 0 refills | Status: DC | PRN
Start: 2021-05-01 — End: 2021-06-23

## 2021-05-01 NOTE — Progress Notes (Addendum)
Patient ID: Dana Mcmillan, female   DOB: [birth year], 26 y.o.   MRN: [id number] ?Post Partum Day #1 ?Subjective: ?no complaints, up ad lib, voiding, and tolerating PO ? ?Objective: ?Blood pressure 133/89, pulse (!) 57, temperature 97.7 ?F (36.5 ?C), temperature source Oral, resp. rate 18, height 5\' 6"  (1.676 m), weight 65 kg, last menstrual period 08/07/2020, SpO2 98 %, unknown if currently breastfeeding. ? ?Physical Exam:  ?General: alert, cooperative, and no distress ?Lochia: appropriate ?Uterine Fundus: firm ?Incision: NA ?DVT Evaluation: No evidence of DVT seen on physical exam. ? ?Recent Labs  ?  04/29/21 ?1145  ?HGB 12.0  ?HCT 35.2*  ? ? ?Assessment/Plan: ?Plan for discharge later today possibly ? ? LOS: 2 days  ? ?Verita Schneiders, MD ?05/01/2021, 10:35 AM  ? ? ?

## 2021-05-01 NOTE — Progress Notes (Signed)
MOB was referred for history of depression/anxiety. ?* Referral screened out by Clinical Social Worker because none of the following criteria appear to apply: ?~ History of anxiety/depression during this pregnancy, or of post-partum depression. ?~ Diagnosis of anxiety and/or depression within last 3 years. No concerns noted on [facility] records. ? ?OR ?* MOB's symptoms currently being treated with medication and/or therapy. ? ?Edinburgh Score is 0. ? ?Please contact the Clinical Social Worker if needs arise, or if MOB requests. ? ?Silvio Sausedo Boyd-Gilyard, MSW, LCSW ?Clinical Social Work ?[phone] ?

## 2021-05-02 ENCOUNTER — Ambulatory Visit: Payer: BLUE CROSS/BLUE SHIELD

## 2021-05-04 ENCOUNTER — Encounter: Payer: BLUE CROSS/BLUE SHIELD | Admitting: Obstetrics and Gynecology

## 2021-05-04 ENCOUNTER — Ambulatory Visit: Payer: BLUE CROSS/BLUE SHIELD

## 2021-05-11 ENCOUNTER — Encounter: Payer: BLUE CROSS/BLUE SHIELD | Admitting: Obstetrics and Gynecology

## 2021-05-12 ENCOUNTER — Telehealth (HOSPITAL_COMMUNITY): Payer: Self-pay | Admitting: *Deleted

## 2021-05-12 NOTE — Telephone Encounter (Signed)
Attempted hospital discharge follow-up call. Left message for patient to return RN call. Deforest Hoyles, RN, 05/12/21, [phone] ?

## 2021-05-14 ENCOUNTER — Inpatient Hospital Stay (HOSPITAL_COMMUNITY): Admit: 2021-05-14 | Payer: Self-pay

## 2021-05-18 ENCOUNTER — Encounter: Payer: BLUE CROSS/BLUE SHIELD | Admitting: Family Medicine

## 2021-05-23 ENCOUNTER — Other Ambulatory Visit: Payer: BLUE CROSS/BLUE SHIELD

## 2021-05-23 ENCOUNTER — Ambulatory Visit: Payer: BLUE CROSS/BLUE SHIELD

## 2021-05-30 ENCOUNTER — Encounter: Payer: Self-pay | Admitting: Family Medicine

## 2021-05-30 ENCOUNTER — Ambulatory Visit (INDEPENDENT_AMBULATORY_CARE_PROVIDER_SITE_OTHER): Payer: BLUE CROSS/BLUE SHIELD | Admitting: Family Medicine

## 2021-05-30 DIAGNOSIS — Z9189 Other specified personal risk factors, not elsewhere classified: Secondary | ICD-10-CM | POA: Diagnosis not present

## 2021-05-30 MED ORDER — METOCLOPRAMIDE HCL 10 MG PO TABS: 10.0000 mg | ORAL_TABLET | Freq: Three times a day (TID) | ORAL | 0 refills | Status: AC

## 2021-05-30 NOTE — Progress Notes (Signed)
? ? ?Post Partum Visit Note ? ?Dana Mcmillan is a 26 y.o. G25P1001 female who presents for a postpartum visit. She is 4 weeks postpartum following a normal spontaneous vaginal delivery.  I have fully reviewed the prenatal and intrapartum course. The delivery was at 44 gestational weeks.  Anesthesia: epidural. Postpartum course has been unremarkable. Baby is doing well. Baby is feeding by breast. Bleeding staining only and goes from very light to heavy . Bowel function is normal. Bladder function is normal. Patient is sexually active. Contraception method is none. Postpartum depression screening: negative. ? ? Upstream - 05/30/21 1715   ? ?  ? Pregnancy Intention Screening  ? Does the patient want to become pregnant in the next year? No   ? Does the patient's partner want to become pregnant in the next year? No   ? Would the patient like to discuss contraceptive options today? Yes   ?  ? Contraception Wrap Up  ? Current Method Withdrawal or Other Method   ? End Method Withdrawal or Other Method   ? Contraception Counseling Provided Yes   ? ?  ?  ? ?  ? ?The pregnancy intention screening data noted above was reviewed. Potential methods of contraception were discussed. The patient elected to proceed with Withdrawal or Other Method. ? ? Edinburgh Postnatal Depression Scale - 05/30/21 1609   ? ?  ? Edinburgh Postnatal Depression Scale:  In the Past 7 Days  ? I have been able to laugh and see the funny side of things. 0   ? I have looked forward with enjoyment to things. 0   ? I have blamed myself unnecessarily when things went wrong. 0   ? I have been anxious or worried for no good reason. 0   ? I have felt scared or panicky for no good reason. 0   ? Things have been getting on top of me. 0   ? I have been so unhappy that I have had difficulty sleeping. 0   ? I have felt sad or miserable. 0   ? I have been so unhappy that I have been crying. 0   ? The thought of harming myself has occurred to me. 0   ? Edinburgh  Postnatal Depression Scale Total 0   ? ?  ?  ? ?  ? ? ?Health Maintenance Due  ?Topic Date Due  ? COVID-19 Vaccine (1) Never done  ? ? ?The following portions of the patient's history were reviewed and updated as appropriate: allergies, current medications, past family history, past medical history, past social history, past surgical history, and problem list. ? ?Review of Systems ?Pertinent items are noted in HPI. ? ?Objective:  ?BP 120/74   Pulse 90   Wt 126 lb (57.2 kg)   Breastfeeding Yes   BMI 20.34 kg/m?   ? ?General:  alert, cooperative, and appears stated age  ? Breasts:  abnormal pump trauma on right nipple  ?Lungs: clear to auscultation bilaterally  ?Heart:  regular rate and rhythm, S1, S2 normal, no murmur, click, rub or gallop  ?Abdomen: soft, non-tender; bowel sounds normal; no masses,  no organomegaly   ?Wound NA  ?GU exam:  normal  ?     ?Assessment:  ? ? ?Normal postpartum exam.  ? ?Plan:  ? ?Essential components of care per ACOG recommendations: ? ?1.  Mood and well being: Patient with negative depression screening today. Reviewed local resources for support.  ?- Patient tobacco use? No.   ?-  hx of drug use? No.   ? ?2. Infant care and feeding:  ?-Patient currently breastmilk feeding? Yes. Discussed returning to work and pumping. Reviewed importance of draining breast regularly to support lactation.  ?-- Patient was told by [facility] to pump on highest setting and reports pump trauma to right and decrease in supply subsequently. Discussed galactogues and patient would like to try reglan. Counseled on SE profile and trial of 2 weeks.  ?-Social determinants of health (SDOH) reviewed in EPIC. No concerns ? ? ?3. Sexuality, contraception and birth spacing ?- Patient does not want a pregnancy in the next year.  Desired family size is 2 children.  ?- Reviewed reproductive life planning. Reviewed contraceptive methods based on pt preferences and effectiveness.  Patient desired Withdrawal or Other Method  today.   ?- Discussed birth spacing of 18 months ? ?4. Sleep and fatigue ?-Encouraged family/partner/community support of 4 hrs of uninterrupted sleep to help with mood and fatigue ? ?5. Physical Recovery  ?- Discussed patients delivery and complications. She describes her labor as good. ?- Patient had a Vaginal, no problems at delivery. 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 Patient had a  preurethral  laceration. Perineal healing reviewed. Patient expressed understanding ?- Patient has urinary incontinence? No. ?- Patient is safe to resume physical and sexual activity ? ?6.  Health Maintenance ?- HM due items addressed Yes ?- Last pap smear  ?Diagnosis  ?Date Value Ref Range Status  ?11/01/2020   Final  ? - Negative for intraepithelial lesion or malignancy (NILM)  ? Pap smear not done at today's visit.  ?-Breast Cancer screening indicated? No.  ? ?7. Chronic Disease/Pregnancy Condition follow up: None ? ?1. Breastfeeding problem ?- metoCLOPramide (REGLAN) 10 MG tablet; Take 1 tablet (10 mg total) by mouth 3 (three) times daily before meals.  Dispense: 42 tablet; Refill: 0 ? ?2. Postpartum exam ? ?- PCP follow up ? ?Caren Macadam, MD ?[facility] [place]  ?

## 2021-06-22 ENCOUNTER — Other Ambulatory Visit (INDEPENDENT_AMBULATORY_CARE_PROVIDER_SITE_OTHER): Payer: Self-pay | Admitting: Obstetrics & Gynecology

## 2021-06-23 ENCOUNTER — Other Ambulatory Visit: Payer: Self-pay | Admitting: Obstetrics and Gynecology

## 2021-06-23 MED ORDER — IBUPROFEN 600 MG PO TABS: 600.0000 mg | ORAL_TABLET | Freq: Four times a day (QID) | ORAL | 0 refills | Status: AC | PRN

## 2021-07-06 ENCOUNTER — Ambulatory Visit (HOSPITAL_COMMUNITY)
Admission: EM | Admit: 2021-07-06 | Discharge: 2021-07-06 | Disposition: A | Discharge status: Home / Self Care | Payer: BLUE CROSS/BLUE SHIELD | Attending: Family Medicine | Admitting: Family Medicine

## 2021-07-06 ENCOUNTER — Encounter (HOSPITAL_COMMUNITY): Payer: Self-pay | Admitting: Emergency Medicine

## 2021-07-06 DIAGNOSIS — L0291 Cutaneous abscess, unspecified: Secondary | ICD-10-CM

## 2021-07-06 DIAGNOSIS — N764 Abscess of vulva: Secondary | ICD-10-CM | POA: Diagnosis not present

## 2021-07-06 MED ORDER — DOXYCYCLINE HYCLATE 100 MG PO CAPS: 100.0000 mg | ORAL_CAPSULE | Freq: Two times a day (BID) | ORAL | 0 refills | Status: AC

## 2021-07-06 MED ORDER — LIDOCAINE HCL (PF) 1 % IJ SOLN
INTRAMUSCULAR | Status: AC
  Filled 2021-07-06: qty 30

## 2021-07-06 NOTE — ED Triage Notes (Signed)
Pt is present today with concerns of a in grown hair on the left side of her bikini line. Pt states she noticed it one week ago

## 2021-07-06 NOTE — ED Provider Notes (Signed)
[facility]   [id number] 07/06/21 Arrival Time: 1659  ASSESSMENT & PLAN:  1. Abscess     Incision and Drainage Procedure Note  Anesthesia: 1% plain lidocaine  Procedure Details  The procedure, risks and complications have been discussed in detail (including, but not limited to, pain and bleeding) with the patient.  The skin induration was prepped and draped in the usual fashion. After adequate local anesthesia, I&D with a #11 blade was performed just lateral to mid LEFT labia majora with moderate purulent drainage.  EBL: minimal Drains: none Packing: n/a Condition: Tolerated procedure well Complications: none.  Begin: Meds ordered this encounter  Medications   doxycycline (VIBRAMYCIN) 100 MG capsule    Sig: Take 1 capsule (100 mg total) by mouth 2 (two) times daily.    Dispense:  14 capsule    Refill:  0   Wound care instructions discussed and given in written format. To return in 48 hours for wound check if needed. Tylenol for pain.  Reviewed expectations re: course of current medical issues. Questions answered. Outlined signs and symptoms indicating need for more acute intervention. Patient verbalized understanding. After Visit Summary given.   SUBJECTIVE:  Dana Mcmillan is a 25 y.o. female who presents with a possible infection of her left groin/vagina; noted after shaving area sev d ago. Without drainage/bleeding. Symptoms have gradually worsened since beginning; pain and overlying erythema. Fever: absent. OTC/home treatment: none.   OBJECTIVE:  Vitals:   07/06/21 1728  BP: 124/82  Pulse: 76  Resp: 18  Temp: 98.3 F (36.8 C)  SpO2: 98%    General appearance: alert; no distress GU: just lateral to LEFT labia majora there is an approx 1 x 0.5 cm induration present, tender to touch; no active drainage or bleeding; with overlying erythema Psychological: alert and cooperative; normal mood and affect  Allergies  Allergen Reactions   Latex  Rash    Past Medical History:  Diagnosis Date   Anxiety    MDD (major depressive disorder)    Panic disorder with agoraphobia and moderate panic attacks    Social History   Socioeconomic History   Marital status: Single    Spouse name: Not on file   Number of children: Not on file   Years of education: Not on file   Highest education level: Not on file  Occupational History   Not on file  Tobacco Use   Smoking status: Former    Types: Cigarettes    Quit date: 01/04/2019    Years since quitting: 2.5    Passive exposure: Never   Smokeless tobacco: Never  Vaping Use   Vaping Use: Every day   Substances: Nicotine, CBD, Flavoring  Substance and Sexual Activity   Alcohol use: Not Currently    Comment: not since confirmed pregnancy   Drug use: Not Currently    Comment: not since confirmed pregnancy   Sexual activity: Yes    Partners: Male    Birth control/protection: None  Other Topics Concern   Not on file  Social History Narrative   Not on file   Social Determinants of Health   Financial Resource Strain: Not on file  Food Insecurity: Not on file  Transportation Needs: Not on file  Physical Activity: Not on file  Stress: Not on file  Social Connections: Not on file   Family History  Problem Relation Age of Onset   Prostate cancer Paternal Grandfather    Past Surgical History:  Procedure Laterality Date  ANKLE SURGERY Right 09/2019   WISDOM TOOTH EXTRACTION  2017            Mardella Layman, MD 07/06/21 [phone]
